# Patient Record
Sex: Male | Born: 1980 | Race: White | Marital: Married | Smoking: Current some day smoker
Health system: Southern US, Community
[De-identification: ages and names within clinical notes are randomized; demographics above are authoritative.]

## PROBLEM LIST (undated history)

## (undated) DIAGNOSIS — Z87442 Personal history of urinary calculi: Secondary | ICD-10-CM

## (undated) DIAGNOSIS — M1990 Unspecified osteoarthritis, unspecified site: Secondary | ICD-10-CM

## (undated) DIAGNOSIS — R5382 Chronic fatigue, unspecified: Secondary | ICD-10-CM

## (undated) DIAGNOSIS — R030 Elevated blood-pressure reading, without diagnosis of hypertension: Secondary | ICD-10-CM

## (undated) DIAGNOSIS — G479 Sleep disorder, unspecified: Secondary | ICD-10-CM

## (undated) DIAGNOSIS — F419 Anxiety disorder, unspecified: Secondary | ICD-10-CM

## (undated) DIAGNOSIS — F32A Depression, unspecified: Secondary | ICD-10-CM

## (undated) DIAGNOSIS — M549 Dorsalgia, unspecified: Secondary | ICD-10-CM

## (undated) DIAGNOSIS — M109 Gout, unspecified: Secondary | ICD-10-CM

## (undated) DIAGNOSIS — R011 Cardiac murmur, unspecified: Secondary | ICD-10-CM

## (undated) DIAGNOSIS — F329 Major depressive disorder, single episode, unspecified: Secondary | ICD-10-CM

## (undated) HISTORY — DX: Dorsalgia, unspecified: M54.9

## (undated) HISTORY — DX: Chronic fatigue, unspecified: R53.82

## (undated) HISTORY — DX: Major depressive disorder, single episode, unspecified: F32.9

## (undated) HISTORY — DX: Cardiac murmur, unspecified: R01.1

## (undated) HISTORY — DX: Elevated blood-pressure reading, without diagnosis of hypertension: R03.0

## (undated) HISTORY — PX: BACK SURGERY: SHX140

## (undated) HISTORY — DX: Sleep disorder, unspecified: G47.9

## (undated) HISTORY — DX: Anxiety disorder, unspecified: F41.9

## (undated) HISTORY — PX: TONSILLECTOMY: SUR1361

## (undated) HISTORY — DX: Unspecified osteoarthritis, unspecified site: M19.90

## (undated) HISTORY — DX: Gout, unspecified: M10.9

## (undated) HISTORY — DX: Depression, unspecified: F32.A

---

## 2014-03-01 ENCOUNTER — Other Ambulatory Visit: Payer: Self-pay | Admitting: Orthopedic Surgery

## 2014-03-01 DIAGNOSIS — M545 Low back pain, unspecified: Secondary | ICD-10-CM

## 2014-03-07 ENCOUNTER — Other Ambulatory Visit: Payer: Self-pay

## 2014-03-08 ENCOUNTER — Other Ambulatory Visit: Payer: Self-pay

## 2014-03-08 ENCOUNTER — Inpatient Hospital Stay: Admission: RE | Admit: 2014-03-08 | Payer: Self-pay | Source: Ambulatory Visit

## 2014-03-14 ENCOUNTER — Other Ambulatory Visit: Payer: Self-pay

## 2014-03-14 ENCOUNTER — Other Ambulatory Visit: Payer: Self-pay | Admitting: Orthopedic Surgery

## 2014-03-14 ENCOUNTER — Ambulatory Visit
Admission: RE | Admit: 2014-03-14 | Discharge: 2014-03-14 | Disposition: A | Discharge status: Home / Self Care | Payer: Managed Care, Other (non HMO) | Source: Ambulatory Visit | Attending: Orthopedic Surgery | Admitting: Orthopedic Surgery

## 2014-03-14 VITALS — BP 126/70 | HR 56 | Temp 98.0°F | Resp 14 | Ht >= 80 in | Wt 265.0 lb

## 2014-03-14 DIAGNOSIS — M545 Low back pain, unspecified: Secondary | ICD-10-CM

## 2014-03-14 DIAGNOSIS — M5440 Lumbago with sciatica, unspecified side: Secondary | ICD-10-CM

## 2014-03-14 MED ORDER — KETOROLAC TROMETHAMINE 30 MG/ML IJ SOLN
30.0000 mg | Freq: Once | INTRAMUSCULAR | Status: AC
  Administered 2014-03-14: 30 mg via INTRAVENOUS

## 2014-03-14 MED ORDER — VANCOMYCIN HCL 10 G IV SOLR
1500.0000 mg | Freq: Once | INTRAVENOUS | Status: AC
  Administered 2014-03-14: 1500 mg via INTRAVENOUS

## 2014-03-14 MED ORDER — MIDAZOLAM HCL 2 MG/2ML IJ SOLN
1.0000 mg | INTRAMUSCULAR | Status: DC | PRN
  Administered 2014-03-14 (×2): 1 mg via INTRAVENOUS

## 2014-03-14 MED ORDER — IOHEXOL 180 MG/ML  SOLN: 5.5000 mL | Freq: Once | INTRAMUSCULAR | Status: AC | PRN

## 2014-03-14 MED ORDER — FENTANYL CITRATE 0.05 MG/ML IJ SOLN
25.0000 ug | INTRAMUSCULAR | Status: DC | PRN
  Administered 2014-03-14: 100 ug via INTRAVENOUS

## 2014-03-14 MED ORDER — MEPERIDINE HCL 100 MG/ML IJ SOLN
100.0000 mg | Freq: Once | INTRAMUSCULAR | Status: AC
  Administered 2014-03-14: 100 mg via INTRAMUSCULAR

## 2014-03-14 MED ORDER — SODIUM CHLORIDE 0.9 % IV SOLN
Freq: Once | INTRAVENOUS | Status: AC
  Administered 2014-03-14: 08:00:00 via INTRAVENOUS

## 2014-03-14 MED ORDER — ONDANSETRON HCL 4 MG/2ML IJ SOLN
4.0000 mg | Freq: Once | INTRAMUSCULAR | Status: AC
  Administered 2014-03-14: 4 mg via INTRAMUSCULAR

## 2014-03-14 NOTE — Discharge Instructions (Signed)
Discogram Post Procedure Discharge Instructions ° °1. May resume a regular diet and any medications that you routinely take (including pain medications). °2. No driving day of procedure. °3. Upon discharge go home and rest for at least 4 hours.  May use an ice pack as needed to injection sites on back.  Ice to back 30 minutes on and 30 minutes off, all day. °4. May remove bandades later, today. °5. It is not unusual to be sore for several days after this procedure. ° ° ° °Please contact our office at [phone] for the following symptoms: ° °· Fever greater than 100 degrees °· Increased swelling, pain, or redness at injection site. ° ° °Thank you for visiting [facility]. ° ° °

## 2014-03-16 ENCOUNTER — Telehealth: Payer: Self-pay | Admitting: Radiology

## 2014-03-16 NOTE — Telephone Encounter (Signed)
Pt's wife called yesterday about pt's positional headache. Pt was asked to do 24 hours more bedrest and blood patch explained. Called this am to check on him and headache is gone. We do have an order for a blood patch if it is still needed.

## 2014-03-17 ENCOUNTER — Telehealth: Payer: Self-pay | Admitting: Radiology

## 2014-04-19 DIAGNOSIS — M549 Dorsalgia, unspecified: Secondary | ICD-10-CM | POA: Insufficient documentation

## 2014-11-24 ENCOUNTER — Encounter (HOSPITAL_COMMUNITY): Payer: Self-pay | Admitting: Cardiology

## 2014-11-24 ENCOUNTER — Emergency Department (HOSPITAL_COMMUNITY): Payer: No Typology Code available for payment source

## 2014-11-24 ENCOUNTER — Emergency Department (HOSPITAL_COMMUNITY)
Admission: EM | Admit: 2014-11-24 | Discharge: 2014-11-24 | Disposition: A | Discharge status: Home / Self Care | Payer: No Typology Code available for payment source | Attending: Emergency Medicine | Admitting: Emergency Medicine

## 2014-11-24 DIAGNOSIS — S335XXA Sprain of ligaments of lumbar spine, initial encounter: Secondary | ICD-10-CM

## 2014-11-24 DIAGNOSIS — Z880 Allergy status to penicillin: Secondary | ICD-10-CM | POA: Diagnosis not present

## 2014-11-24 DIAGNOSIS — S199XXA Unspecified injury of neck, initial encounter: Secondary | ICD-10-CM | POA: Diagnosis present

## 2014-11-24 DIAGNOSIS — Y9389 Activity, other specified: Secondary | ICD-10-CM | POA: Diagnosis not present

## 2014-11-24 DIAGNOSIS — Y92410 Unspecified street and highway as the place of occurrence of the external cause: Secondary | ICD-10-CM | POA: Diagnosis not present

## 2014-11-24 DIAGNOSIS — S139XXA Sprain of joints and ligaments of unspecified parts of neck, initial encounter: Secondary | ICD-10-CM

## 2014-11-24 DIAGNOSIS — S39012A Strain of muscle, fascia and tendon of lower back, initial encounter: Secondary | ICD-10-CM | POA: Insufficient documentation

## 2014-11-24 DIAGNOSIS — Y998 Other external cause status: Secondary | ICD-10-CM | POA: Insufficient documentation

## 2014-11-24 DIAGNOSIS — S339XXA Sprain of unspecified parts of lumbar spine and pelvis, initial encounter: Secondary | ICD-10-CM

## 2014-11-24 DIAGNOSIS — S134XXA Sprain of ligaments of cervical spine, initial encounter: Secondary | ICD-10-CM | POA: Diagnosis not present

## 2014-11-24 MED ORDER — OXYCODONE-ACETAMINOPHEN 5-325 MG PO TABS
2.0000 | ORAL_TABLET | Freq: Once | ORAL | Status: AC
  Administered 2014-11-24: 2 via ORAL
  Filled 2014-11-24: qty 2

## 2014-11-24 MED ORDER — TRAMADOL HCL 50 MG PO TABS: 50.0000 mg | ORAL_TABLET | Freq: Four times a day (QID) | ORAL | Status: DC | PRN
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
## 2014-11-24 NOTE — Discharge Instructions (Signed)
Cervical Sprain °A cervical sprain is an injury in the neck in which the strong, fibrous tissues (ligaments) that connect your neck bones stretch or tear. Cervical sprains can range from mild to severe. Severe cervical sprains can cause the neck vertebrae to be unstable. This can lead to damage of the spinal cord and can result in serious nervous system problems. The amount of time it takes for a cervical sprain to get better depends on the cause and extent of the injury. Most cervical sprains heal in 1 to 3 weeks. °CAUSES  °Severe cervical sprains may be caused by:  °· Contact sport injuries (such as from football, rugby, wrestling, hockey, auto racing, gymnastics, diving, martial arts, or boxing).   °· Motor vehicle collisions.   °· Whiplash injuries. This is an injury from a sudden forward and backward whipping movement of the head and neck.  °· Falls.   °Mild cervical sprains may be caused by:  °· Being in an awkward position, such as while cradling a telephone between your ear and shoulder.   °· Sitting in a chair that does not offer proper support.   °· Working at a poorly designed computer station.   °· Looking up or down for long periods of time.   °SYMPTOMS  °· Pain, soreness, stiffness, or a burning sensation in the front, back, or sides of the neck. This discomfort may develop immediately after the injury or slowly, 24 hours or more after the injury.   °· Pain or tenderness directly in the middle of the back of the neck.   °· Shoulder or upper back pain.   °· Limited ability to move the neck.   °· Headache.   °· Dizziness.   °· Weakness, numbness, or tingling in the hands or arms.   °· Muscle spasms.   °· Difficulty swallowing or chewing.   °· Tenderness and swelling of the neck.   °DIAGNOSIS  °Most of the time your health care provider can diagnose a cervical sprain by taking your history and doing a physical exam. Your health care provider will ask about previous neck injuries and any known neck  problems, such as arthritis in the neck. X-rays may be taken to find out if there are any other problems, such as with the bones of the neck. Other tests, such as a CT scan or MRI, may also be needed.  °TREATMENT  °Treatment depends on the severity of the cervical sprain. Mild sprains can be treated with rest, keeping the neck in place (immobilization), and pain medicines. Severe cervical sprains are immediately immobilized. Further treatment is done to help with pain, muscle spasms, and other symptoms and may include: °· Medicines, such as pain relievers, numbing medicines, or muscle relaxants.   °· Physical therapy. This may involve stretching exercises, strengthening exercises, and posture training. Exercises and improved posture can help stabilize the neck, strengthen muscles, and help stop symptoms from returning.   °HOME CARE INSTRUCTIONS  °· Put ice on the injured area.   °¨ Put ice in a plastic bag.   °¨ Place a towel between your skin and the bag.   °¨ Leave the ice on for 15-20 minutes, 3-4 times a day.   °· If your injury was severe, you may have been given a cervical collar to wear. A cervical collar is a two-piece collar designed to keep your neck from moving while it heals. °¨ Do not remove the collar unless instructed by your health care provider. °¨ If you have long hair, keep it outside of the collar. °¨ Ask your health care provider before making any adjustments to your collar. Minor   adjustments may be required over time to improve comfort and reduce pressure on your chin or on the back of your head. °¨ If you are allowed to remove the collar for cleaning or bathing, follow your health care provider's instructions on how to do so safely. °¨ Keep your collar clean by wiping it with mild soap and water and drying it completely. If the collar you have been given includes removable pads, remove them every 1-2 days and hand wash them with soap and water. Allow them to air dry. They should be completely  dry before you wear them in the collar. °¨ If you are allowed to remove the collar for cleaning and bathing, wash and dry the skin of your neck. Check your skin for irritation or sores. If you see any, tell your health care provider. °¨ Do not drive while wearing the collar.   °· Only take over-the-counter or prescription medicines for pain, discomfort, or fever as directed by your health care provider.   °· Keep all follow-up appointments as directed by your health care provider.   °· Keep all physical therapy appointments as directed by your health care provider.   °· Make any needed adjustments to your workstation to promote good posture.   °· Avoid positions and activities that make your symptoms worse.   °· Warm up and stretch before being active to help prevent problems.   °SEEK MEDICAL CARE IF:  °· Your pain is not controlled with medicine.   °· You are unable to decrease your pain medicine over time as planned.   °· Your activity level is not improving as expected.   °SEEK IMMEDIATE MEDICAL CARE IF:  °· You develop any bleeding. °· You develop stomach upset. °· You have signs of an allergic reaction to your medicine.   °· Your symptoms get worse.   °· You develop new, unexplained symptoms.   °· You have numbness, tingling, weakness, or paralysis in any part of your body.   °MAKE SURE YOU:  °· Understand these instructions. °· Will watch your condition. °· Will get help right away if you are not doing well or get worse. °Document Released: 06/30/2007 Document Revised: 09/07/2013 Document Reviewed: 03/10/2013 °Exit[employer]® Patient Information ©2015 [employer]. This information is not intended to replace advice given to you by your health care provider. Make sure you discuss any questions you have with your health care provider. ° °Motor Vehicle Collision °It is common to have multiple bruises and sore muscles after a motor vehicle collision (MVC). These tend to feel worse for the first 24 hours. You may have  the most stiffness and soreness over the first several hours. You may also feel worse when you wake up the first morning after your collision. After this point, you will usually begin to improve with each day. The speed of improvement often depends on the severity of the collision, the number of injuries, and the location and nature of these injuries. °HOME CARE INSTRUCTIONS °· Put ice on the injured area. °¨ Put ice in a plastic bag. °¨ Place a towel between your skin and the bag. °¨ Leave the ice on for 15-20 minutes, 3-4 times a day, or as directed by your health care provider. °· Drink enough fluids to keep your urine clear or pale yellow. Do not drink alcohol. °· Take a warm shower or bath once or twice a day. This will increase blood flow to sore muscles. °· You may return to activities as directed by your caregiver. Be careful when lifting, as this may aggravate neck or back   pain. °· Only take over-the-counter or prescription medicines for pain, discomfort, or fever as directed by your caregiver. Do not use aspirin. This may increase bruising and bleeding. °SEEK IMMEDIATE MEDICAL CARE IF: °· You have numbness, tingling, or weakness in the arms or legs. °· You develop severe headaches not relieved with medicine. °· You have severe neck pain, especially tenderness in the middle of the back of your neck. °· You have changes in bowel or bladder control. °· There is increasing pain in any area of the body. °· You have shortness of breath, light-headedness, dizziness, or fainting. °· You have chest pain. °· You feel sick to your stomach (nauseous), throw up (vomit), or sweat. °· You have increasing abdominal discomfort. °· There is blood in your urine, stool, or vomit. °· You have pain in your shoulder (shoulder strap areas). °· You feel your symptoms are getting worse. °MAKE SURE YOU: °· Understand these instructions. °· Will watch your condition. °· Will get help right away if you are not doing well or get  worse. °Document Released: 09/02/2005 Document Revised: 01/17/2014 Document Reviewed: 01/30/2011 °ExitCare® Patient Information ©2015 [employer]. This information is not intended to replace advice given to you by your health care provider. Make sure you discuss any questions you have with your health care provider. ° °
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
## 2016-06-03 ENCOUNTER — Encounter (HOSPITAL_COMMUNITY): Payer: Self-pay

## 2016-06-03 ENCOUNTER — Ambulatory Visit (HOSPITAL_COMMUNITY): Payer: Self-pay | Admitting: Psychiatry

## 2016-07-17 ENCOUNTER — Encounter: Payer: Self-pay | Admitting: Neurology

## 2016-07-18 ENCOUNTER — Ambulatory Visit: Payer: Managed Care, Other (non HMO) | Admitting: Neurology

## 2016-07-18 ENCOUNTER — Telehealth: Payer: Self-pay

## 2016-07-18 NOTE — Telephone Encounter (Signed)
Pt no-showed his new patient appt this morning. 

## 2016-07-23 ENCOUNTER — Encounter: Payer: Self-pay | Admitting: Neurology

## 2016-08-12 ENCOUNTER — Ambulatory Visit: Payer: Managed Care, Other (non HMO) | Admitting: Neurology

## 2016-08-12 ENCOUNTER — Telehealth: Payer: Self-pay

## 2016-08-12 NOTE — Telephone Encounter (Signed)
Pt no showed 2nd new patient appt this morning.

## 2016-08-13 ENCOUNTER — Encounter: Payer: Self-pay | Admitting: Neurology

## 2019-04-22 ENCOUNTER — Encounter: Payer: Self-pay | Admitting: *Deleted

## 2019-04-23 ENCOUNTER — Telehealth: Payer: Self-pay | Admitting: Cardiology

## 2019-04-23 ENCOUNTER — Other Ambulatory Visit: Payer: Self-pay

## 2019-04-23 ENCOUNTER — Ambulatory Visit (INDEPENDENT_AMBULATORY_CARE_PROVIDER_SITE_OTHER): Payer: 59 | Admitting: Cardiology

## 2019-04-23 ENCOUNTER — Encounter: Payer: Self-pay | Admitting: *Deleted

## 2019-04-23 ENCOUNTER — Encounter: Payer: Self-pay | Admitting: Cardiology

## 2019-04-23 VITALS — BP 121/77 | HR 58 | Ht >= 80 in | Wt 348.6 lb

## 2019-04-23 DIAGNOSIS — R011 Cardiac murmur, unspecified: Secondary | ICD-10-CM

## 2019-04-23 NOTE — Progress Notes (Signed)
     Clinical Summary Cody Fuller is a 38 y.o.male seen as new consult, referred by Dr Kelby Aline for heart murmur   1. Heart murmur - some SOB at times he attributes to weight gain - has had some issues with chronic fatigue - does 2 miles twice a week jogging a week.  - mild edema at times     Past Medical History:  Diagnosis Date  . Back pain   . Chronic fatigue   . Depression   . Elevated blood pressure reading without diagnosis of hypertension   . Heart murmur   . Sleep disorder      Allergies  Allergen Reactions  . Codeine   . Penicillins      Current Outpatient Medications  Medication Sig Dispense Refill  . CREATINE PO Take 3 tablets by mouth 2 (two) times daily.    Marland Kitchen FLUoxetine (PROZAC) 40 MG capsule Take 40 mg by mouth daily.    Marland Kitchen oxyCODONE-acetaminophen (PERCOCET) 10-325 MG tablet Take 1 tablet by mouth every 4 (four) hours as needed for pain.    . tizanidine (ZANAFLEX) 2 MG capsule Take 2 mg by mouth daily as needed for muscle spasms.    . traMADol (ULTRAM) 50 MG tablet Take 1 tablet (50 mg total) by mouth every 6 (six) hours as needed. 10 tablet 0   No current facility-administered medications for this visit.      Past Surgical History:  Procedure Laterality Date  . TONSILLECTOMY       Allergies  Allergen Reactions  . Codeine   . Penicillins       No family history on file.   Social History Cody Fuller reports that he has never smoked. He has never used smokeless tobacco. Cody Fuller reports no history of alcohol use.   Review of Systems CONSTITUTIONAL: No weight loss, fever, chills, weakness or fatigue.  HEENT: Eyes: No visual loss, blurred vision, double vision or yellow sclerae.No hearing loss, sneezing, congestion, runny nose or sore throat.  SKIN: No rash or itching.  CARDIOVASCULAR: per hpi RESPIRATORY: No shortness of breath, cough or sputum.  GASTROINTESTINAL: No anorexia, nausea, vomiting or diarrhea. No abdominal pain or blood.   GENITOURINARY: No burning on urination, no polyuria NEUROLOGICAL: No headache, dizziness, syncope, paralysis, ataxia, numbness or tingling in the extremities. No change in bowel or bladder control.  MUSCULOSKELETAL: No muscle, back pain, joint pain or stiffness.  LYMPHATICS: No enlarged nodes. No history of splenectomy.  PSYCHIATRIC: No history of depression or anxiety.  ENDOCRINOLOGIC: No reports of sweating, cold or heat intolerance. No polyuria or polydipsia.  Marland Kitchen   Physical Examination Today's Vitals   04/23/19 1537  BP: 121/77  Pulse: (!) 58  SpO2: 98%  Weight: (!) 348 lb 9.6 oz (158.1 kg)  Height: 6\' 8"  (2.032 m)   Body mass index is 38.3 kg/m.  Gen: resting comfortably, no acute distress HEENT: no scleral icterus, pupils equal round and reactive, no palptable cervical adenopathy,  CV: RRR, 2/6 systolic murmur at apex, no jvd Resp: Clear to auscultation bilaterally GI: abdomen is soft, non-tender, non-distended, normal bowel sounds, no hepatosplenomegaly MSK: extremities are warm, no edema.  Skin: warm, no rash Neuro:  no focal deficits Psych: appropriate affect     Assessment and Plan  1. Heart murmur - obtain echo to further evaluate.      Arnoldo Lenis, M.D.

## 2019-04-23 NOTE — Patient Instructions (Addendum)
Medication Instructions:   Your physician recommends that you continue on your current medications as directed. Please refer to the Current Medication list given to you today.  Labwork:  NONE  Testing/Procedures: Your physician has requested that you have an echocardiogram. Echocardiography is a painless test that uses sound waves to create images of your heart. It provides your doctor with information about the size and shape of your heart and how well your heart's chambers and valves are working. This procedure takes approximately one hour. There are no restrictions for this procedure.  Follow-Up:  Your physician recommends that you schedule a follow-up appointment in: pending test result.  Any Other Special Instructions Will Be Listed Below (If Applicable).  If you need a refill on your cardiac medications before your next appointment, please call your pharmacy. 

## 2019-04-23 NOTE — Telephone Encounter (Signed)
°  Precert needed for: Echo ° ° °Location: [facility]  °  ° °Date: May 13, 2019 °

## 2019-05-13 ENCOUNTER — Other Ambulatory Visit: Payer: 59

## 2019-06-29 ENCOUNTER — Telehealth (HOSPITAL_COMMUNITY): Payer: Self-pay | Admitting: Family Medicine

## 2019-06-29 NOTE — Telephone Encounter (Signed)
06/29/19 11:17am Called and left msg for patient due to referral from [place], [place] - waiting for patient to call back/sh

## 2019-07-01 ENCOUNTER — Emergency Department (HOSPITAL_COMMUNITY)
Admission: EM | Admit: 2019-07-01 | Discharge: 2019-07-01 | Disposition: A | Discharge status: Home / Self Care | Payer: 59 | Attending: Emergency Medicine | Admitting: Emergency Medicine

## 2019-07-01 ENCOUNTER — Encounter (HOSPITAL_COMMUNITY): Payer: Self-pay | Admitting: *Deleted

## 2019-07-01 ENCOUNTER — Other Ambulatory Visit: Payer: Self-pay

## 2019-07-01 ENCOUNTER — Emergency Department (HOSPITAL_COMMUNITY): Payer: 59

## 2019-07-01 DIAGNOSIS — Z5321 Procedure and treatment not carried out due to patient leaving prior to being seen by health care provider: Secondary | ICD-10-CM | POA: Diagnosis not present

## 2019-07-01 DIAGNOSIS — R0602 Shortness of breath: Secondary | ICD-10-CM | POA: Insufficient documentation

## 2019-07-01 DIAGNOSIS — M79602 Pain in left arm: Secondary | ICD-10-CM | POA: Diagnosis present

## 2019-07-01 LAB — CBC
HCT: 44.8 % (ref 39.0–52.0)
Hemoglobin: 14.6 g/dL (ref 13.0–17.0)
MCH: 27.1 pg (ref 26.0–34.0)
MCHC: 32.6 g/dL (ref 30.0–36.0)
MCV: 83.1 fL (ref 80.0–100.0)
Platelets: 284 10*3/uL (ref 150–400)
RBC: 5.39 MIL/uL (ref 4.22–5.81)
RDW: 13.4 % (ref 11.5–15.5)
WBC: 7 10*3/uL (ref 4.0–10.5)
nRBC: 0 % (ref 0.0–0.2)

## 2019-07-01 LAB — BASIC METABOLIC PANEL
Anion gap: 11 (ref 5–15)
BUN: 14 mg/dL (ref 6–20)
CO2: 22 mmol/L (ref 22–32)
Calcium: 8.9 mg/dL (ref 8.9–10.3)
Chloride: 106 mmol/L (ref 98–111)
Creatinine, Ser: 1.22 mg/dL (ref 0.61–1.24)
GFR calc Af Amer: >60 mL/min (ref >60)
GFR calc non Af Amer: >60 mL/min (ref >60)
Glucose, Bld: 106 mg/dL — ABNORMAL HIGH (ref 70–99)
Potassium: 3.1 mmol/L — ABNORMAL LOW (ref 3.5–5.1)
Sodium: 139 mmol/L (ref 135–145)

## 2019-07-01 LAB — TROPONIN I (HIGH SENSITIVITY)
Troponin I (High Sensitivity): 8 ng/L (ref <18)
Troponin I (High Sensitivity): 8 ng/L (ref <18)

## 2019-07-01 MED ORDER — SODIUM CHLORIDE 0.9% FLUSH: 3.0000 mL | Freq: Once | INTRAVENOUS | Status: DC
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
## 2020-04-27 ENCOUNTER — Encounter: Payer: Self-pay | Admitting: Urology

## 2020-04-27 ENCOUNTER — Other Ambulatory Visit: Payer: Self-pay

## 2020-04-27 ENCOUNTER — Ambulatory Visit (INDEPENDENT_AMBULATORY_CARE_PROVIDER_SITE_OTHER): Payer: 59 | Admitting: Urology

## 2020-04-27 VITALS — BP 169/84 | HR 71 | Temp 98.4°F | Ht >= 80 in | Wt 290.0 lb

## 2020-04-27 DIAGNOSIS — E291 Testicular hypofunction: Secondary | ICD-10-CM

## 2020-04-27 LAB — URINALYSIS, ROUTINE W REFLEX MICROSCOPIC
Bilirubin, UA: NEGATIVE
Glucose, UA: NEGATIVE
Ketones, UA: NEGATIVE
Leukocytes,UA: NEGATIVE
Nitrite, UA: NEGATIVE
Protein,UA: NEGATIVE
RBC, UA: NEGATIVE
Specific Gravity, UA: 1.02 (ref 1.005–1.030)
Urobilinogen, Ur: 2 mg/dL — ABNORMAL HIGH (ref 0.2–1.0)
pH, UA: 7.5 (ref 5.0–7.5)

## 2020-04-27 NOTE — Progress Notes (Signed)
Urological Symptom Review  Patient is experiencing the following symptoms: Erection problems (male only)   Review of Systems  Gastrointestinal (upper)  : Negative for upper GI symptoms  Gastrointestinal (lower) : Negative for lower GI symptoms  Constitutional : Fatigue  Skin: Negative for skin symptoms  Eyes: Negative for eye symptoms  Ear/Nose/Throat : Negative for Ear/Nose/Throat symptoms  Hematologic/Lymphatic: Negative for Hematologic/Lymphatic symptoms  Cardiovascular : Negative for cardiovascular symptoms  Respiratory : Negative for respiratory symptoms  Endocrine: Negative for endocrine symptoms  Musculoskeletal: Back pain  Neurological: Negative for neurological symptoms  Psychologic: Anxiety 

## 2020-04-27 NOTE — Progress Notes (Signed)
04/27/2020 11:45 AM   Delena Serve 1980-12-07 [id number]  Referring provider: Lawerance Sabal, PA [address] [place],  [place] [address]  Fatigue  HPI: [name] is a 39yo here for evaluation of hypogonadism. For the past 2 years he has noticed progressive fatigue, decreased libido, weight gain. Per patient his testosterone was checked and was low normal. He has gained 30lbs in the past 6 months. Decreased exercise desire and tolerance. No hx of testicular trauma.  He has issues maintaining an erection. He has always been athletic but now he cannot workout and exercise.    PMH: Past Medical History:  Diagnosis Date  . Anxiety   . Arthritis   . Back pain   . Chronic fatigue   . Depression   . Elevated blood pressure reading without diagnosis of hypertension   . Gout   . Heart murmur   . Sleep disorder     Surgical History: Past Surgical History:  Procedure Laterality Date  . TONSILLECTOMY      Home Medications:  Allergies as of 04/27/2020      Reactions   Codeine    Penicillins       Medication List       Accurate as of April 27, 2020 11:45 AM. If you have any questions, ask your nurse or doctor.        busPIRone 10 MG tablet Commonly known as: BUSPAR Take 10 mg by mouth 3 (three) times daily.   FLUoxetine 40 MG capsule Commonly known as: PROZAC Take 40 mg by mouth daily.   oxyCODONE-acetaminophen 10-325 MG tablet Commonly known as: PERCOCET Take 1 tablet by mouth every 4 (four) hours as needed for pain.   tizanidine 2 MG capsule Commonly known as: ZANAFLEX Take 2 mg by mouth daily as needed for muscle spasms.       Allergies:  Allergies  Allergen Reactions  . Codeine   . Penicillins     Family History: Family History  Problem Relation Age of Onset  . HIV Father   . Cancer Mother     Social History:  reports that he has been smoking cigarettes. He started smoking about 19 months ago. He has never used smokeless tobacco. He reports that he  does not drink alcohol and does not use drugs.  ROS: All other review of systems were reviewed and are negative except what is noted above in HPI  Physical Exam: BP (!) 169/84   Pulse 71   Temp 98.4 F (36.9 C)   Ht 6\' 8"  (2.032 m)   Wt 290 lb (131.5 kg)   BMI 31.86 kg/m   Constitutional:  Alert and oriented, No acute distress. HEENT: [place] AT, moist mucus membranes.  Trachea midline, no masses. Cardiovascular: No clubbing, cyanosis, or edema. Respiratory: Normal respiratory effort, no increased work of breathing. GI: Abdomen is soft, nontender, nondistended, no abdominal masses GU: No CVA tenderness. Circumcised phallus. No masses/lesions on penis, testis, scrotum. Prostate 40g smooth no nodules no induration.  Lymph: No cervical or inguinal lymphadenopathy. Skin: No rashes, bruises or suspicious lesions. Neurologic: Grossly intact, no focal deficits, moving all 4 extremities. Psychiatric: Normal mood and affect.  Laboratory Data: Lab Results  Component Value Date   WBC 7.0 07/01/2019   HGB 14.6 07/01/2019   HCT 44.8 07/01/2019   MCV 83.1 07/01/2019   PLT 284 07/01/2019    Lab Results  Component Value Date   CREATININE 1.22 07/01/2019    No results found for: PSA  No results found for: TESTOSTERONE  No results found for: HGBA1C  Urinalysis No results found for: COLORURINE, APPEARANCEUR, LABSPEC, PHURINE, GLUCOSEU, HGBUR, BILIRUBINUR, KETONESUR, PROTEINUR, UROBILINOGEN, NITRITE, LEUKOCYTESUR  No results found for: LABMICR, WBCUA, RBCUA, LABEPIT, MUCUS, BACTERIA  Pertinent Imaging:  No results found for this or any previous visit.  No results found for this or any previous visit.  No results found for this or any previous visit.  No results found for this or any previous visit.  No results found for this or any previous visit.  No results found for this or any previous visit.  No results found for this or any previous visit.  No results found for this or  any previous visit.   Assessment & Plan:    1. Hypogonadism male -We will obtain testosterone labs prior to initiating therapy  -The patient and I talked at length about hypogonadism and erectile dysfunction.  Several etiologies of hypogonadism were discussed with the patient at length.  A wide range of treatment options were discussed with the patient including conservative or no treatment, testosterone supplementation, and others.  Alternative treatment options were discussed with the patient in detail.  All questions were answered.      The patient gave fully informed consent to proceed with testosterone supplementation therapy for his hypogonadism.  He understands that with testosterone supplementation therapy, we are only supplementing his serum testosterone levels, not raising his levels above the normal range.  He understands that testosterone may promote the growth of benign prostate tissue and result in bothersome voiding symptoms, which he will report to me.  He also understands that it is important to take testosterone exactly the way it is prescribed without deviation.      We talked about the risks, benefits and some of the possible side effects of various methods of testosterone administration, including tablets, IM injections, topical creams and ointments, and topical epidermal patches.      We will begin testosterone supplementation for his hypogonadism.                                                                 The patient was given instructions to call for abdominal pain, pelvic pain, perirectal pain, nausea, vomiting, diarrhea, fever over 100 degrees F, chills, hematuria, dysuria, frequency, urgency, or urge incontinence.    No follow-ups on file.  Wilkie Aye, MD  [facility] [place]

## 2020-04-27 NOTE — Patient Instructions (Signed)
Testosterone Replacement Therapy  Testosterone replacement therapy (TRT) is used to treat men who have a low testosterone level (hypogonadism). Testosterone is a male hormone that is produced in the testicles. It is responsible for typically male characteristics and for maintaining a man's sex drive and the ability to get an erection. Testosterone also supports bone and muscle health. TRT can be a gel, liquid, or patch that you put on your skin. It can also be in the form of a tablet or an injection. In some cases, your health care provider may insert long-acting pellets under your skin. In most men, the level of testosterone starts to decline gradually after age 45. Low testosterone can also be caused by certain medical conditions, medicines, and obesity. Your health care provider can diagnose hypogonadism with at least two blood tests that are done early in the morning. Low testosterone may not need to be treated. TRT is usually a choice that you make with your health care provider. Your health care provider may recommend TRT if you have low testosterone that is causing symptoms, such as:  Low sex drive.  Erection problems.  Breast enlargement.  Loss of body hair.  Weak muscles or bones.  Shrinking testicles.  Increased body fat.  Low energy.  Hot flashes.  Depression.  Decreased work performance. TRT is a lifetime treatment. If you stop treatment, your testosterone will drop, and your symptoms may return. What are the risks? Testosterone replacement therapy may have side effects, including:  Lower sperm count.  Skin irritation at the application or injection site.  Mouth irritation if you take an oral tablet.  Acne.  Swelling of your legs or feet.  Tender breasts.  Dizziness.  Sleep disturbance.  Mood swings.  Possible increased risk of stroke or heart attack. Testosterone replacement therapy may also increase your risk for prostate cancer or male breast cancer.  You should not use TRT if you have either of those conditions. Your health care provider also may not recommend TRT if:  You are suspected of having prostate cancer.  You want to father a child.  You have a high number of red blood cells.  You have untreated sleep apnea.  You have a very large prostate. Supplies needed:  Your health care provider will prescribe the testosterone gel, solution, or medicine that you need. If your health care provider teaches you to do self-injections at home, you will also need: ? Your medicine vial. ? Disposable needles and syringes. ? Alcohol swabs. ? A needle disposal container. ? Adhesive bandages. How to use testosterone replacement therapy Your health care provider will help you find the TRT option that will work best for you based on your preference, the side effects, and the cost. You may:  Rub testosterone gel on your upper arm or shoulder every day after a shower. This is the most common type of TRT. Do not let women or children come in contact with the gel.  Apply a testosterone solution under your arms once each day.  Place a testosterone patch on your skin once each day.  Dissolve a testosterone tablet in your mouth twice each day.  Have a testosterone pellet inserted under your skin by your health care provider. This will be replaced every 3-6 months.  Use testosterone nasal spray three times each day.  Get testosterone injections. For some types of testosterone, your health care provider will give you this injection. With other types of testosterone, you may be taught to give injections to   yourself. The frequency of injections may vary based on the type of testosterone that you receive. Follow these instructions at home:  Take over-the-counter and prescription medicines only as told by your health care provider.  Lose weight if you are overweight. Ask your health care provider to help you start a healthy diet and exercise program to  reach and maintain a healthy weight.  Work with your health care provider to treat other medical conditions that may lower your testosterone. These include obesity, high blood pressure, high cholesterol, diabetes, liver disease, kidney disease, and sleep apnea.  Keep all follow-up visits as told by your health care provider. This is important. General recommendations  Discuss all risks and benefits with your health care provider before starting therapy.  Work with your health care provider to check your prostate health and do blood testing before you start therapy.  Do not use any testosterone replacement therapies that are not prescribed by your health care provider or not approved for use in the [place]  Do not use TRT for bodybuilding or to improve sexual performance. TRT should be used only to treat symptoms of low testosterone.  Return for all repeat prostate checks and blood tests during therapy, as told by your health care provider. Where to find more information Learn more about testosterone replacement therapy from:  American U[employer] Foundation: www.urologyhealth.org/urologic-conditions/low-testosterone-(hypogonadism)  Endocrine Society: www.hormone.org/diseases-and-conditions/mens-health/hypogonadism Contact a health care provider if:  You have side effects from your testosterone replacement therapy.  You continue to have symptoms of low testosterone during treatment.  You develop new symptoms during treatment. Summary  Testosterone replacement therapy is only for men who have low testosterone as determined by blood testing and who have symptoms of low testosterone.  Testosterone replacement therapy should be prescribed only by a health care provider and should be used under the supervision of a health care provider.  You may not be able to take testosterone if you have certain medical conditions, including prostate cancer, male breast cancer, or heart  disease.  Testosterone replacement therapy may have side effects and may make some medical conditions worse.  Talk with your health care provider about all the risks and benefits before you start therapy. This information is not intended to replace advice given to you by your health care provider. Make sure you discuss any questions you have with your health care provider. Document Revised: 09/15/2016 Document Reviewed: 05/23/2016 Elsevier Patient Education  2020 Elsevier Inc.  

## 2020-04-27 NOTE — Addendum Note (Signed)
Addended byGustavus Messing on: 04/27/2020 01:12 PM   Modules accepted: Orders

## 2020-04-28 ENCOUNTER — Other Ambulatory Visit: Payer: Self-pay

## 2020-04-28 DIAGNOSIS — E291 Testicular hypofunction: Secondary | ICD-10-CM

## 2020-04-29 LAB — CBC WITH DIFFERENTIAL
Basophils Absolute: 0 10*3/uL (ref 0.0–0.2)
Basos: 1 %
EOS (ABSOLUTE): 0.1 10*3/uL (ref 0.0–0.4)
Eos: 2 %
Hematocrit: 49.3 % (ref 37.5–51.0)
Hemoglobin: 16.4 g/dL (ref 13.0–17.7)
Immature Grans (Abs): 0 10*3/uL (ref 0.0–0.1)
Immature Granulocytes: 0 %
Lymphocytes Absolute: 1.8 10*3/uL (ref 0.7–3.1)
Lymphs: 40 %
MCH: 27.6 pg (ref 26.6–33.0)
MCHC: 33.3 g/dL (ref 31.5–35.7)
MCV: 83 fL (ref 79–97)
Monocytes Absolute: 0.5 10*3/uL (ref 0.1–0.9)
Monocytes: 10 %
Neutrophils Absolute: 2.1 10*3/uL (ref 1.4–7.0)
Neutrophils: 47 %
RBC: 5.95 x10E6/uL — ABNORMAL HIGH (ref 4.14–5.80)
RDW: 13 % (ref 11.6–15.4)
WBC: 4.5 10*3/uL (ref 3.4–10.8)

## 2020-04-29 LAB — COMPREHENSIVE METABOLIC PANEL
ALT: 25 IU/L (ref 0–44)
AST: 26 IU/L (ref 0–40)
Albumin/Globulin Ratio: 1.8 (ref 1.2–2.2)
Albumin: 4.4 g/dL (ref 4.0–5.0)
Alkaline Phosphatase: 56 IU/L (ref 48–121)
BUN/Creatinine Ratio: 11 (ref 9–20)
BUN: 13 mg/dL (ref 6–20)
Bilirubin Total: 0.6 mg/dL (ref 0.0–1.2)
CO2: 24 mmol/L (ref 20–29)
Calcium: 9.5 mg/dL (ref 8.7–10.2)
Chloride: 103 mmol/L (ref 96–106)
Creatinine, Ser: 1.16 mg/dL (ref 0.76–1.27)
GFR calc Af Amer: 93 mL/min/{1.73_m2} (ref >59)
GFR calc non Af Amer: 81 mL/min/{1.73_m2} (ref >59)
Globulin, Total: 2.4 g/dL (ref 1.5–4.5)
Glucose: 96 mg/dL (ref 65–99)
Potassium: 4.9 mmol/L (ref 3.5–5.2)
Sodium: 141 mmol/L (ref 134–144)
Total Protein: 6.8 g/dL (ref 6.0–8.5)

## 2020-04-29 LAB — ESTRADIOL: Estradiol: 27 pg/mL (ref 7.6–42.6)

## 2020-05-03 ENCOUNTER — Telehealth: Payer: Self-pay

## 2020-05-03 LAB — TESTOSTERONE,FREE AND TOTAL
Testosterone, Free: 8.1 pg/mL — ABNORMAL LOW (ref 8.7–25.1)
Testosterone: 865 ng/dL (ref 264–916)

## 2020-05-03 NOTE — Telephone Encounter (Signed)
-----   Message from Malen Gauze, MD sent at 05/03/2020  7:35 AM EDT ----- Testosterone labs were normal. He can try b12 10000 units BID ----- Message ----- From: Gustavus Messing, LPN Sent: 01/31/6159   8:09 AM EDT To: Malen Gauze, MD  Please review

## 2020-05-08 NOTE — Telephone Encounter (Signed)
Pt made aware

## 2020-07-27 ENCOUNTER — Ambulatory Visit: Payer: Self-pay | Admitting: Urology

## 2020-07-27 DIAGNOSIS — E291 Testicular hypofunction: Secondary | ICD-10-CM

## 2021-06-03 IMAGING — CR DG CHEST 2V
2 series · 2 of 2 positions shown · non-contrast
Comparison: None.

CLINICAL DATA: 37-year-old male with sudden onset mid chest pain
and shortness of breath.

EXAM:
CHEST - 2 VIEW

[w chest pa]
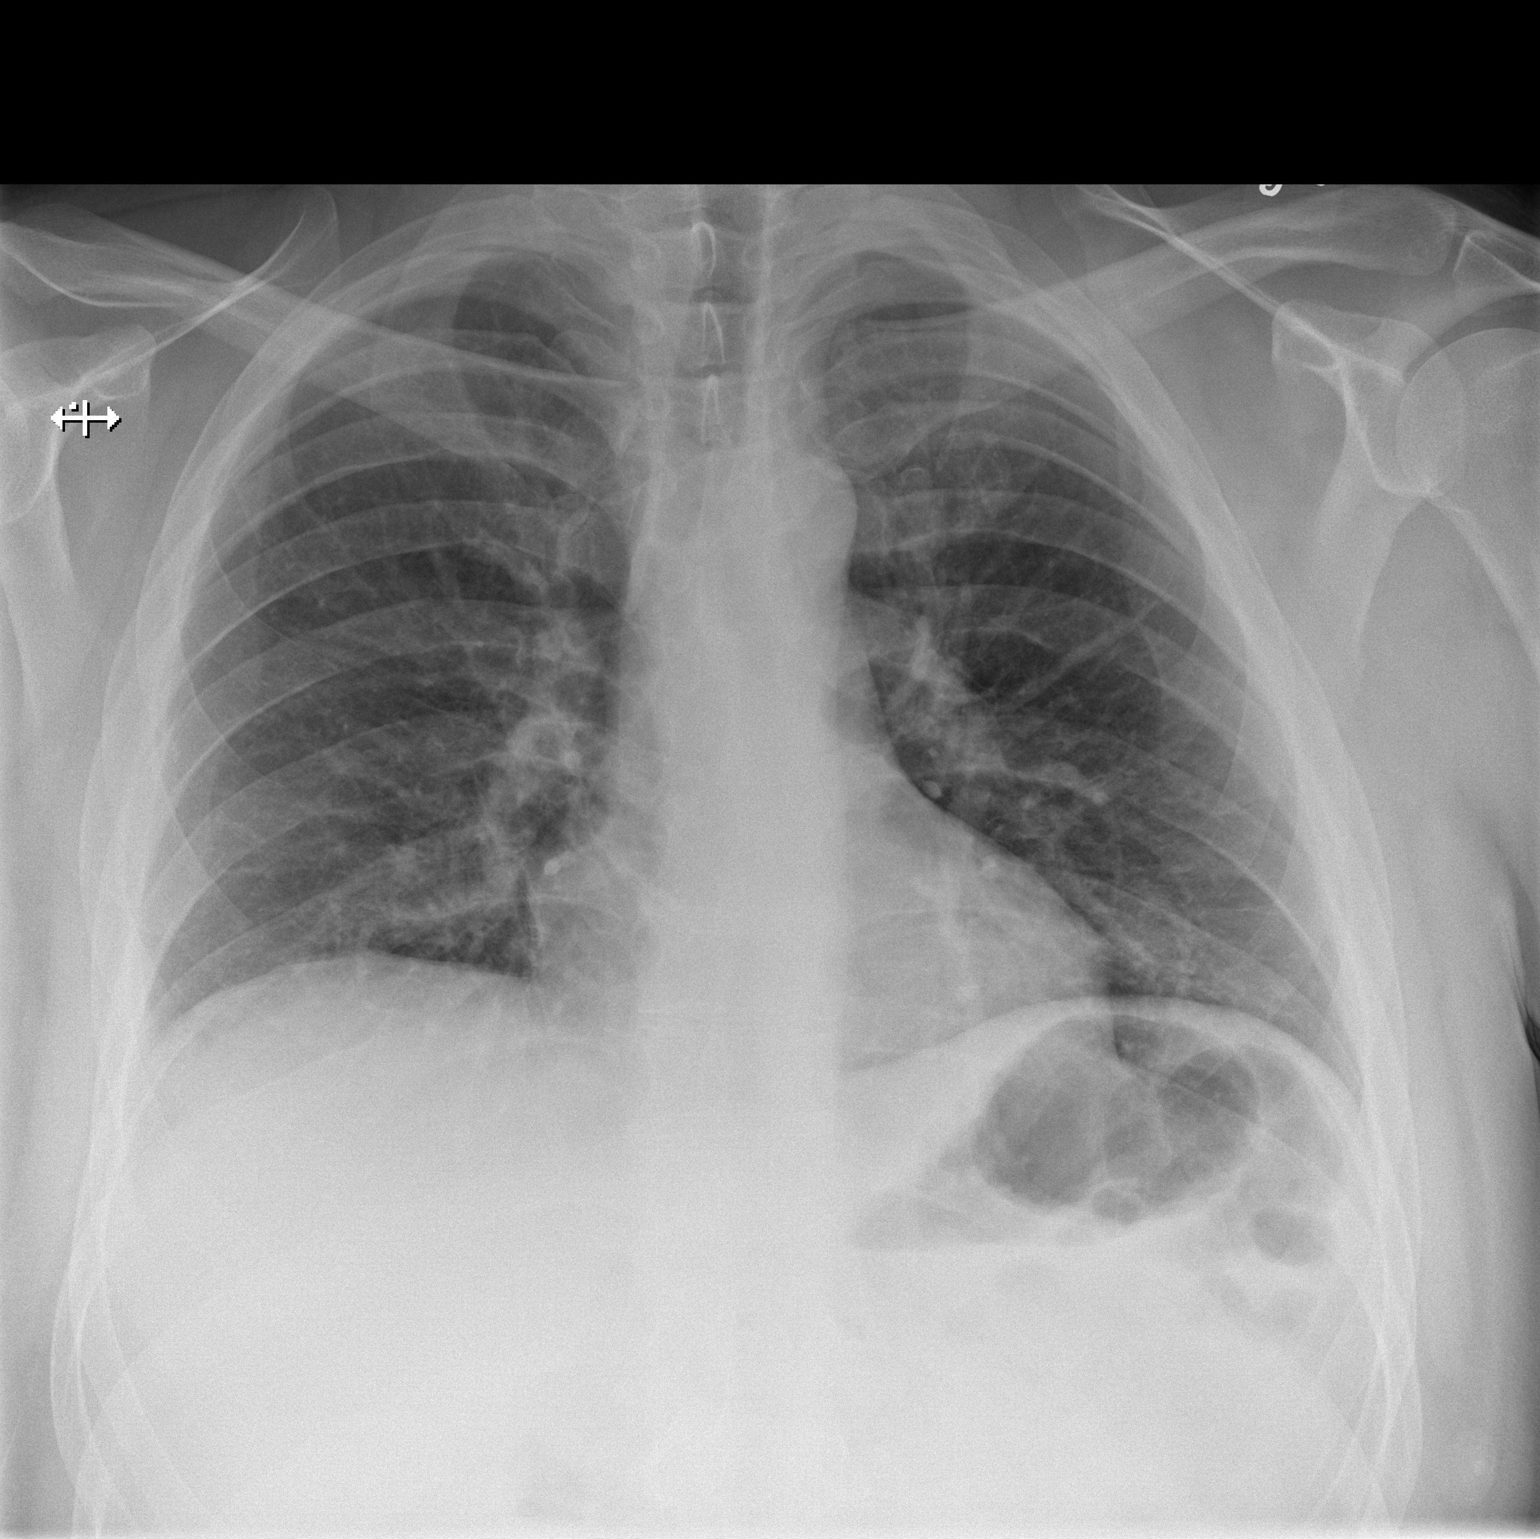

[w chest lat]
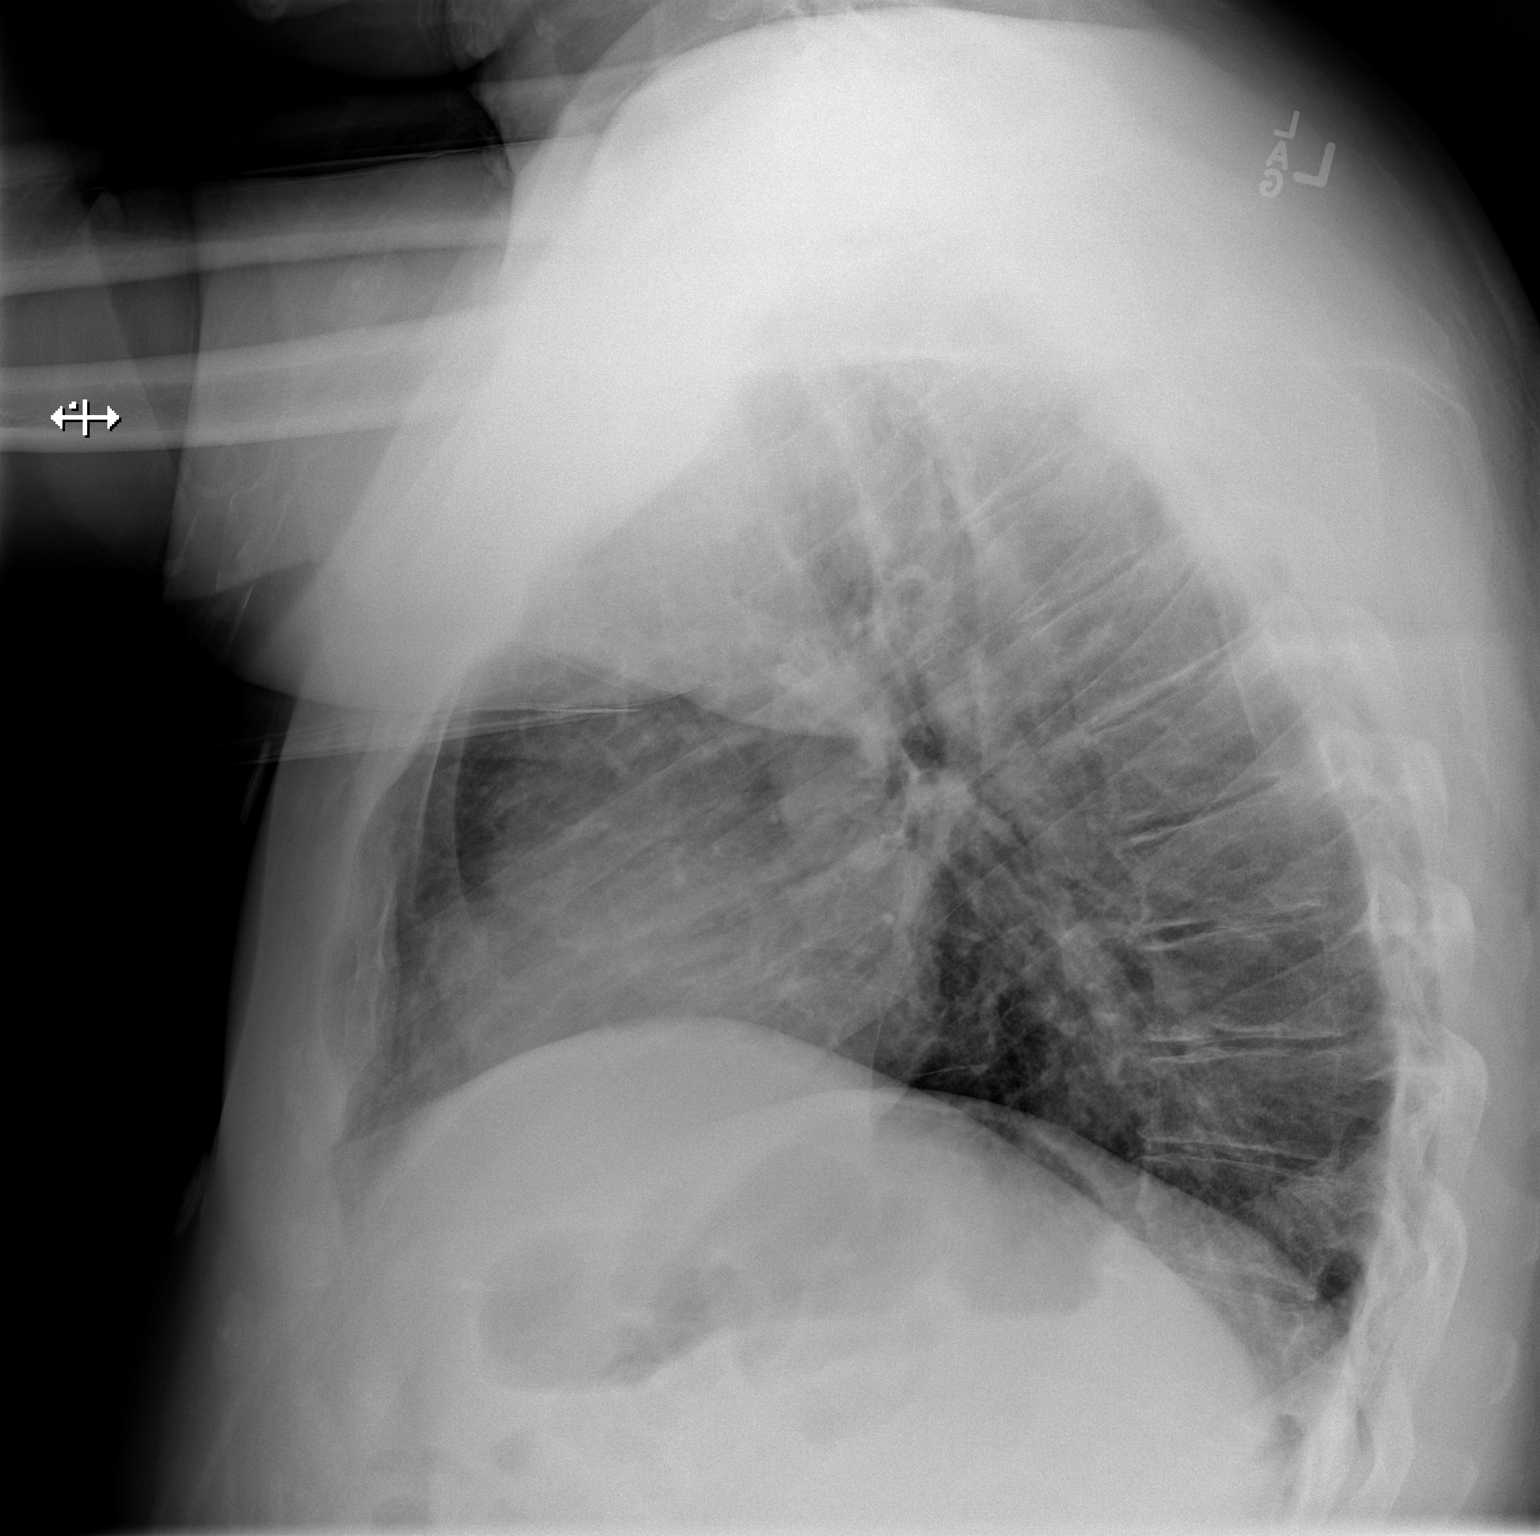

[2 of 2 positions shown; findings below may reference images not displayed]

FINDINGS: Mildly low lung volumes. Normal cardiac size and mediastinal
contours. Visualized tracheal air column is within normal limits.
Both lungs appear clear. No pneumothorax or pleural effusion.

Negative visible bowel gas pattern. No acute osseous abnormality
identified.
IMPRESSION: Negative; somewhat low lung volumes with no cardiopulmonary
abnormality.

## 2024-04-19 ENCOUNTER — Emergency Department (HOSPITAL_COMMUNITY)

## 2024-04-19 ENCOUNTER — Emergency Department (HOSPITAL_COMMUNITY)
Admission: EM | Admit: 2024-04-19 | Discharge: 2024-04-19 | Disposition: A | Discharge status: Home / Self Care | Source: Ambulatory Visit | Attending: Emergency Medicine | Admitting: Emergency Medicine

## 2024-04-19 ENCOUNTER — Other Ambulatory Visit: Payer: Self-pay

## 2024-04-19 ENCOUNTER — Encounter (HOSPITAL_COMMUNITY): Payer: Self-pay | Admitting: Emergency Medicine

## 2024-04-19 DIAGNOSIS — R109 Unspecified abdominal pain: Secondary | ICD-10-CM

## 2024-04-19 DIAGNOSIS — N200 Calculus of kidney: Secondary | ICD-10-CM | POA: Insufficient documentation

## 2024-04-19 LAB — CBC WITH DIFFERENTIAL/PLATELET
Abs Immature Granulocytes: 0.02 K/uL (ref 0.00–0.07)
Basophils Absolute: 0.1 K/uL (ref 0.0–0.1)
Basophils Relative: 1 %
Eosinophils Absolute: 0.1 K/uL (ref 0.0–0.5)
Eosinophils Relative: 2 %
HCT: 50 % (ref 39.0–52.0)
Hemoglobin: 16.7 g/dL (ref 13.0–17.0)
Immature Granulocytes: 0 %
Lymphocytes Relative: 39 %
Lymphs Abs: 1.9 K/uL (ref 0.7–4.0)
MCH: 27.5 pg (ref 26.0–34.0)
MCHC: 33.4 g/dL (ref 30.0–36.0)
MCV: 82.4 fL (ref 80.0–100.0)
Monocytes Absolute: 0.5 K/uL (ref 0.1–1.0)
Monocytes Relative: 9 %
Neutro Abs: 2.4 K/uL (ref 1.7–7.7)
Neutrophils Relative %: 49 %
Platelets: 211 K/uL (ref 150–400)
RBC: 6.07 MIL/uL — ABNORMAL HIGH (ref 4.22–5.81)
RDW: 13.3 % (ref 11.5–15.5)
WBC: 4.9 K/uL (ref 4.0–10.5)
nRBC: 0 % (ref 0.0–0.2)

## 2024-04-19 LAB — COMPREHENSIVE METABOLIC PANEL WITH GFR
ALT: 35 U/L (ref 0–44)
AST: 27 U/L (ref 15–41)
Albumin: 4.3 g/dL (ref 3.5–5.0)
Alkaline Phosphatase: 42 U/L (ref 38–126)
Anion gap: 11 (ref 5–15)
BUN: 15 mg/dL (ref 6–20)
CO2: 23 mmol/L (ref 22–32)
Calcium: 9.3 mg/dL (ref 8.9–10.3)
Chloride: 100 mmol/L (ref 98–111)
Creatinine, Ser: 1.25 mg/dL — ABNORMAL HIGH (ref 0.61–1.24)
GFR, Estimated: >60 mL/min (ref >60)
Glucose, Bld: 146 mg/dL — ABNORMAL HIGH (ref 70–99)
Potassium: 3.7 mmol/L (ref 3.5–5.1)
Sodium: 134 mmol/L — ABNORMAL LOW (ref 135–145)
Total Bilirubin: 1.5 mg/dL — ABNORMAL HIGH (ref 0.0–1.2)
Total Protein: 8.1 g/dL (ref 6.5–8.1)

## 2024-04-19 MED ORDER — KETOROLAC TROMETHAMINE 15 MG/ML IJ SOLN
15.0000 mg | Freq: Once | INTRAMUSCULAR | Status: AC
Start: 2024-04-19 — End: 2024-04-19
  Administered 2024-04-19: 15 mg via INTRAVENOUS
  Filled 2024-04-19: qty 1

## 2024-04-19 MED ORDER — TAMSULOSIN HCL 0.4 MG PO CAPS: 0.4000 mg | ORAL_CAPSULE | Freq: Every day | ORAL | 0 refills | Status: DC

## 2024-04-19 MED ORDER — HYDROMORPHONE HCL 1 MG/ML IJ SOLN
1.0000 mg | Freq: Once | INTRAMUSCULAR | Status: AC
  Administered 2024-04-19: 1 mg via INTRAVENOUS
  Filled 2024-04-19: qty 1

## 2024-04-19 MED ORDER — ONDANSETRON HCL 4 MG/2ML IJ SOLN
4.0000 mg | Freq: Once | INTRAMUSCULAR | Status: AC
  Administered 2024-04-19: 4 mg via INTRAVENOUS
  Filled 2024-04-19: qty 2

## 2024-04-19 MED ORDER — OXYCODONE-ACETAMINOPHEN 5-325 MG PO TABS: 1.0000 | ORAL_TABLET | Freq: Four times a day (QID) | ORAL | 0 refills | Status: DC | PRN

## 2024-04-19 MED ORDER — SODIUM CHLORIDE 0.9 % IV BOLUS
1000.0000 mL | Freq: Once | INTRAVENOUS | Status: AC
  Administered 2024-04-19: 1000 mL via INTRAVENOUS

## 2024-04-19 MED ORDER — CYCLOBENZAPRINE HCL 10 MG PO TABS: 10.0000 mg | ORAL_TABLET | Freq: Three times a day (TID) | ORAL | 0 refills | Status: DC | PRN

## 2024-04-19 NOTE — ED Provider Notes (Signed)
  EMERGENCY DEPARTMENT AT [facility] Provider Note   CSN: [id number] Arrival date & time: 04/19/24  1405     Patient presents with: Flank Pain   Cody Fuller is a 43 y.o. male.  He is here with left low back pain radiating to his left lower quadrant it has been going on over a week.  Associated with some urinary burning.  PCP put him on an antibiotic.  Had an ultrasound on Friday and was told today that he has a kidney stone.  Pain is unbearable.  No fevers nausea vomiting.  History of kidney stone in the past.  {Add pertinent medical, surgical, social history, OB history to YEP:67052} The history is provided by the patient.  Flank Pain This is a new problem. The current episode started more than 1 week ago. The problem occurs constantly. The problem has not changed since onset.Associated symptoms include abdominal pain. Pertinent negatives include no chest pain, no headaches and no shortness of breath. Nothing aggravates the symptoms. Nothing relieves the symptoms. He has tried rest for the symptoms. The treatment provided no relief.       Prior to Admission medications   Medication Sig Start Date End Date Taking? Authorizing Provider  busPIRone (BUSPAR) 10 MG tablet Take 10 mg by mouth 3 (three) times daily.    [provider]  FLUoxetine (PROZAC) 40 MG capsule Take 40 mg by mouth daily.    [provider]  oxyCODONE -acetaminophen  (PERCOCET) 10-325 MG tablet Take 1 tablet by mouth every 4 (four) hours as needed for pain.    [provider]  tizanidine (ZANAFLEX) 2 MG capsule Take 2 mg by mouth daily as needed for muscle spasms.    [provider]    Allergies: Codeine and Penicillins    Review of Systems  Respiratory:  Negative for shortness of breath.   Cardiovascular:  Negative for chest pain.  Gastrointestinal:  Positive for abdominal pain.  Genitourinary:  Positive for dysuria and flank pain.  Musculoskeletal:  Positive  for back pain.  Neurological:  Negative for headaches.    Updated Vital Signs BP (!) 168/99 (BP Location: Left Arm)   Pulse 71   Temp 98.5 F (36.9 C) (Oral)   Resp 19   SpO2 94%   Physical Exam Vitals and nursing note reviewed.  Constitutional:      General: He is not in acute distress.    Appearance: Normal appearance. He is well-developed.  HENT:     Head: Normocephalic and atraumatic.  Eyes:     Conjunctiva/sclera: Conjunctivae normal.  Cardiovascular:     Rate and Rhythm: Normal rate and regular rhythm.     Heart sounds: No murmur heard. Pulmonary:     Effort: Pulmonary effort is normal. No respiratory distress.     Breath sounds: Normal breath sounds.  Abdominal:     Palpations: Abdomen is soft.     Tenderness: There is no abdominal tenderness. There is no guarding or rebound.  Musculoskeletal:        General: No swelling.     Cervical back: Neck supple.  Skin:    General: Skin is warm and dry.     Capillary Refill: Capillary refill takes less than 2 seconds.  Neurological:     General: No focal deficit present.     Mental Status: He is alert.     Motor: No weakness.  Psychiatric:        Mood and Affect: Mood normal.     (  all labs ordered are listed, but only abnormal results are displayed) Labs Reviewed  COMPREHENSIVE METABOLIC PANEL WITH GFR - Abnormal; Notable for the following components:      Result Value   Sodium 134 (*)    Glucose, Bld 146 (*)    Creatinine, Ser 1.25 (*)    Total Bilirubin 1.5 (*)    All other components within normal limits  CBC WITH DIFFERENTIAL/PLATELET - Abnormal; Notable for the following components:   RBC 6.07 (*)    All other components within normal limits  URINALYSIS, ROUTINE W REFLEX MICROSCOPIC    EKG: None  Radiology: No results found.  {Document cardiac monitor, telemetry assessment procedure when appropriate:32947} Procedures   Medications Ordered in the ED  HYDROmorphone  (DILAUDID ) injection 1 mg (has  no administration in time range)  sodium chloride  0.9 % bolus 1,000 mL (has no administration in time range)  ketorolac  (TORADOL ) 15 MG/ML injection 15 mg (15 mg Intravenous Given 04/19/24 1516)  ondansetron  (ZOFRAN ) injection 4 mg (4 mg Intravenous Given 04/19/24 1515)      {Click here for ABCD2, HEART and other calculators REFRESH Note before signing:1}                              Medical Decision Making Amount and/or Complexity of Data Reviewed Labs: ordered.  Risk Prescription drug management.   This patient complains of ***; this involves an extensive number of treatment Options and is a complaint that carries with it a high risk of complications and morbidity. The differential includes ***  I ordered, reviewed and interpreted labs, which included *** I ordered medication *** and reviewed PMP when indicated. I ordered imaging studies which included *** and I independently    visualized and interpreted imaging which showed *** Additional history obtained from *** Previous records obtained and reviewed *** I consulted *** and discussed lab and imaging findings and discussed disposition.  Cardiac monitoring reviewed, *** Social determinants considered, *** Critical Interventions: ***  After the interventions stated above, I reevaluated the patient and found *** Admission and further testing considered, ***   {Document critical care time when appropriate  Document review of labs and clinical decision tools ie CHADS2VASC2, etc  Document your independent review of radiology images and any outside records  Document your discussion with family members, caretakers and with consultants  Document social determinants of health affecting pt's care  Document your decision making why or why not admission, treatments were needed:32947:::1}   Final diagnoses:  None    ED Discharge Orders     None

## 2024-04-19 NOTE — ED Triage Notes (Signed)
 MD told him to come over due to 1.1 kidney stone in his left kidney. He reports extreme pain.

## 2024-04-19 NOTE — ED Notes (Signed)
 Discharge instructions reviewed with patient. Patient questions answered and opportunity for education reviewed. Patient voices understanding of discharge instructions with no further questions.

## 2024-04-19 NOTE — Discharge Instructions (Signed)
 Your CAT scan showed stones in both kidneys but not on the tube going from the kidney to the bladder.  These usually do not cause any symptoms.  We are prescribing you some pain medicine and muscle relaxant along with some medication that may help pass stones.  Please follow with your primary care doctor and urology.  Return if any high fevers or worsening symptoms

## 2024-04-19 NOTE — ED Provider Triage Note (Signed)
 Emergency Medicine Provider Triage Evaluation Note  Carless Slatten , a 43 y.o. male  was evaluated in triage.  Pt complains of left-sided flank pain radiating to the left groin, 1.1 cm stone was noted in the left kidney by ultrasound according to the patient.  His physician sent him to the ED for pain management and ultimate definitive management of his nephro/urolithiasis..  Also notes nausea.  Review of Systems  Positive: As above Negative:   Physical Exam  BP (!) 168/99 (BP Location: Left Arm)   Pulse 71   Temp 98.5 F (36.9 C) (Oral)   Resp 19   SpO2 94%  Gen:   Awake, no distress   Resp:  Normal effort  MSK:   Moves extremities without difficulty  Other:    Medical Decision Making  Medically screening exam initiated at 3:02 PM.  Appropriate orders placed.  Marquet Faircloth was informed that the remainder of the evaluation will be completed by another provider, this initial triage assessment does not replace that evaluation, and the importance of remaining in the ED until their evaluation is complete.  Ordered initial lab evaluation along with CT scan of the abdomen pelvis with renal stone evaluation, ordered pain control with ketorolac  and azithromycin for nausea control.   Myriam Dorn BROCK[place], [place] 04/19/24 1504

## 2024-06-30 ENCOUNTER — Encounter: Payer: Self-pay | Admitting: Urology

## 2024-06-30 ENCOUNTER — Ambulatory Visit (INDEPENDENT_AMBULATORY_CARE_PROVIDER_SITE_OTHER): Admitting: Urology

## 2024-06-30 ENCOUNTER — Ambulatory Visit (HOSPITAL_COMMUNITY)
Admission: RE | Admit: 2024-06-30 | Discharge: 2024-06-30 | Disposition: A | Discharge status: Home / Self Care | Source: Ambulatory Visit | Attending: Urology | Admitting: Urology

## 2024-06-30 VITALS — BP 151/84 | HR 80

## 2024-06-30 DIAGNOSIS — N200 Calculus of kidney: Secondary | ICD-10-CM | POA: Diagnosis present

## 2024-06-30 NOTE — Patient Instructions (Signed)

## 2024-06-30 NOTE — H&P (View-Only) (Signed)
 06/30/2024 2:53 PM   Cody Fuller [birth year] [id number]  Referring provider: Job Bolt, PA [address] [place],  [place] [address]  nephrolithiasis   HPI: Cody Fuller is a 42yo here for evaluation of nephrolithiasis. He presented to the ER with left flank pain over 2 months ago and underwent CT which showed left renal calculi up to 1.1cm and a small right renal calculus. He continues to have intermittent left flank pain. No worsening LUTS   PMH: Past Medical History:  Diagnosis Date   Anxiety    Arthritis    Back pain    Chronic fatigue    Depression    Elevated blood pressure reading without diagnosis of hypertension    Gout    Heart murmur    Sleep disorder     Surgical History: Past Surgical History:  Procedure Laterality Date   TONSILLECTOMY      Home Medications:  Allergies as of 06/30/2024       Reactions   Codeine    Penicillins         Medication List        Accurate as of June 30, 2024  2:53 PM. If you have any questions, ask your nurse or doctor.          buPROPion 300 MG 24 hr tablet Commonly known as: WELLBUTRIN XL Take 300 mg by mouth daily.   busPIRone 10 MG tablet Commonly known as: BUSPAR Take 10 mg by mouth 3 (three) times daily.   cyclobenzaprine  10 MG tablet Commonly known as: FLEXERIL  Take 1 tablet (10 mg total) by mouth 3 (three) times daily as needed for muscle spasms.   FLUoxetine 40 MG capsule Commonly known as: PROZAC Take 40 mg by mouth daily.   oxyCODONE -acetaminophen  10-325 MG tablet Commonly known as: PERCOCET Take 1 tablet by mouth every 4 (four) hours as needed for pain.   oxyCODONE -acetaminophen  5-325 MG tablet Commonly known as: PERCOCET/ROXICET Take 1 tablet by mouth every 6 (six) hours as needed for severe pain (pain score 7-10).   tamsulosin  0.4 MG Caps capsule Commonly known as: FLOMAX  Take 1 capsule (0.4 mg total) by mouth daily.   tizanidine 2 MG capsule Commonly known as:  ZANAFLEX Take 2 mg by mouth daily as needed for muscle spasms.        Allergies:  Allergies  Allergen Reactions   Codeine    Penicillins     Family History: Family History  Problem Relation Age of Onset   HIV Father    Cancer Mother     Social History:  reports that he has been smoking cigarettes. He started smoking about 5 years ago. He has never used smokeless tobacco. He reports that he does not drink alcohol and does not use drugs.  ROS: All other review of systems were reviewed and are negative except what is noted above in HPI  Physical Exam: BP (!) 151/84   Pulse 80   Constitutional:  Alert and oriented, No acute distress. HEENT: [place] AT, moist mucus membranes.  Trachea midline, no masses. Cardiovascular: No clubbing, cyanosis, or edema. Respiratory: Normal respiratory effort, no increased work of breathing. GI: Abdomen is soft, nontender, nondistended, no abdominal masses GU: No CVA tenderness.  Lymph: No cervical or inguinal lymphadenopathy. Skin: No rashes, bruises or suspicious lesions. Neurologic: Grossly intact, no focal deficits, moving all 4 extremities. Psychiatric: Normal mood and affect.  Laboratory Data: Lab Results  Component Value Date   WBC 4.9 04/19/2024  HGB 16.7 04/19/2024   HCT 50.0 04/19/2024   MCV 82.4 04/19/2024   PLT 211 04/19/2024    Lab Results  Component Value Date   CREATININE 1.25 (H) 04/19/2024    No results found for: PSA  Lab Results  Component Value Date   TESTOSTERONE 865 04/28/2020    No results found for: HGBA1C  Urinalysis    Component Value Date/Time   APPEARANCEUR Clear 04/27/2020 1313   GLUCOSEU Negative 04/27/2020 1313   BILIRUBINUR Negative 04/27/2020 1313   PROTEINUR Negative 04/27/2020 1313   NITRITE Negative 04/27/2020 1313   LEUKOCYTESUR Negative 04/27/2020 1313    Lab Results  Component Value Date   LABMICR Comment 04/27/2020    Pertinent Imaging: CT 8/4 and KUB today: Images  reviewed and discussed with the patient  No results found for this or any previous visit.  No results found for this or any previous visit.  No results found for this or any previous visit.  No results found for this or any previous visit.  No results found for this or any previous visit.  No results found for this or any previous visit.  No results found for this or any previous visit.  Results for orders placed during the hospital encounter of 04/19/24  CT Renal Stone Study  Narrative EXAM: CT UROGRAM 04/19/2024 06:26:00 PM  TECHNIQUE: CT of the abdomen and pelvis was performed before and after the administration of intravenous contrast as per CT urogram protocol. Automated exposure control, iterative reconstruction, and/or weight based adjustment of the mA/kV was utilized to reduce the radiation dose to as low as reasonably achievable.  COMPARISON: None available.  CLINICAL HISTORY: Abdominal/flank pain, stone suspected. MD told him to come over due to 1.1 cm kidney stone in his left kidney. He reports extreme pain.  FINDINGS:  LOWER CHEST: No acute abnormality.  LIVER: The liver is unremarkable.  GALLBLADDER AND BILE DUCTS: Gallbladder is unremarkable. No biliary ductal dilatation.  SPLEEN: No acute abnormality.  PANCREAS: No acute abnormality.  ADRENAL GLANDS: No acute abnormality.  KIDNEYS, URETERS AND BLADDER: Bilateral nonobstructing nephrolithiasis. No hydronephrosis. No perinephric or periureteral stranding. Urinary bladder is unremarkable.  GI AND BOWEL: Stomach demonstrates no acute abnormality. There is no bowel obstruction. Normal appendix.  PERITONEUM AND RETROPERITONEUM: No ascites. No free air.  VASCULATURE: Aorta is normal in caliber.  LYMPH NODES: No lymphadenopathy.  REPRODUCTIVE ORGANS: No acute abnormality.  BONES AND SOFT TISSUES: No acute osseous abnormality. No focal soft tissue abnormality.  IMPRESSION: 1.  Bilateral nonobstructing nephrolithiasis, with the largest stone measuring 0.9 cm on the left. 2. No hydronephrosis.  Electronically signed by: Cody Gatlin MD 04/19/2024 07:23 PM EDT RP Workstation: [id number]   Assessment & Plan:    1. Nephrolithiasis (Primary) -We discussed the management of kidney stones. These options include observation, ureteroscopy, shockwave lithotripsy (ESWL) and percutaneous nephrolithotomy (PCNL). We discussed which options are relevant to the patient's stone(s). We discussed the natural history of kidney stones as well as the complications of untreated stones and the impact on quality of life without treatment as well as with each of the above listed treatments. We also discussed the efficacy of each treatment in its ability to clear the stone burden. With any of these management options I discussed the signs and symptoms of infection and the need for emergent treatment should these be experienced. For each option we discussed the ability of each procedure to clear the patient of their stone burden.   For observation I  described the risks which include but are not limited to silent renal damage, life-threatening infection, need for emergent surgery, failure to pass stone and pain.   For ureteroscopy I described the risks which include bleeding, infection, damage to contiguous structures, positioning injury, ureteral stricture, ureteral avulsion, ureteral injury, need for prolonged ureteral stent, inability to perform ureteroscopy, need for an interval procedure, inability to clear stone burden, stent discomfort/pain, heart attack, stroke, pulmonary embolus and the inherent risks with general anesthesia.   For shockwave lithotripsy I described the risks which include arrhythmia, kidney contusion, kidney hemorrhage, need for transfusion, pain, inability to adequately break up stone, inability to pass stone fragments, Steinstrasse, infection associated with obstructing  stones, need for alternate surgical procedure, need for repeat shockwave lithotripsy, MI, CVA, PE and the inherent risks with anesthesia/conscious sedation.   For PCNL I described the risks including positioning injury, pneumothorax, hydrothorax, need for chest tube, inability to clear stone burden, renal laceration, arterial venous fistula or malformation, need for embolization of kidney, loss of kidney or renal function, need for repeat procedure, need for prolonged nephrostomy tube, ureteral avulsion, MI, CVA, PE and the inherent risks of general anesthesia.   - The patient would like to proceed with bilateral ureteroscopic stone extraction - DG Abd 1 View   No follow-ups on file.  [name], MD  [facility]

## 2024-06-30 NOTE — Progress Notes (Signed)
 06/30/2024 2:53 PM   Lynwood Domino [birth year] [id number]  Referring provider: Job Bolt, PA [address] [place],  [place] [address]  nephrolithiasis   HPI: [name] is a 43yo here for evaluation of nephrolithiasis. He presented to the ER with left flank pain over 2 months ago and underwent CT which showed left renal calculi up to 1.1cm and a small right renal calculus. He continues to have intermittent left flank pain. No worsening LUTS   PMH: Past Medical History:  Diagnosis Date   Anxiety    Arthritis    Back pain    Chronic fatigue    Depression    Elevated blood pressure reading without diagnosis of hypertension    Gout    Heart murmur    Sleep disorder     Surgical History: Past Surgical History:  Procedure Laterality Date   TONSILLECTOMY      Home Medications:  Allergies as of 06/30/2024       Reactions   Codeine    Penicillins         Medication List        Accurate as of June 30, 2024  2:53 PM. If you have any questions, ask your nurse or doctor.          buPROPion 300 MG 24 hr tablet Commonly known as: WELLBUTRIN XL Take 300 mg by mouth daily.   busPIRone 10 MG tablet Commonly known as: BUSPAR Take 10 mg by mouth 3 (three) times daily.   cyclobenzaprine  10 MG tablet Commonly known as: FLEXERIL  Take 1 tablet (10 mg total) by mouth 3 (three) times daily as needed for muscle spasms.   FLUoxetine 40 MG capsule Commonly known as: PROZAC Take 40 mg by mouth daily.   oxyCODONE -acetaminophen  10-325 MG tablet Commonly known as: PERCOCET Take 1 tablet by mouth every 4 (four) hours as needed for pain.   oxyCODONE -acetaminophen  5-325 MG tablet Commonly known as: PERCOCET/ROXICET Take 1 tablet by mouth every 6 (six) hours as needed for severe pain (pain score 7-10).   tamsulosin  0.4 MG Caps capsule Commonly known as: FLOMAX  Take 1 capsule (0.4 mg total) by mouth daily.   tizanidine 2 MG capsule Commonly known as:  ZANAFLEX Take 2 mg by mouth daily as needed for muscle spasms.        Allergies:  Allergies  Allergen Reactions   Codeine    Penicillins     Family History: Family History  Problem Relation Age of Onset   HIV Father    Cancer Mother     Social History:  reports that he has been smoking cigarettes. He started smoking about 5 years ago. He has never used smokeless tobacco. He reports that he does not drink alcohol and does not use drugs.  ROS: All other review of systems were reviewed and are negative except what is noted above in HPI  Physical Exam: BP (!) 151/84   Pulse 80   Constitutional:  Alert and oriented, No acute distress. HEENT: [place] AT, moist mucus membranes.  Trachea midline, no masses. Cardiovascular: No clubbing, cyanosis, or edema. Respiratory: Normal respiratory effort, no increased work of breathing. GI: Abdomen is soft, nontender, nondistended, no abdominal masses GU: No CVA tenderness.  Lymph: No cervical or inguinal lymphadenopathy. Skin: No rashes, bruises or suspicious lesions. Neurologic: Grossly intact, no focal deficits, moving all 4 extremities. Psychiatric: Normal mood and affect.  Laboratory Data: Lab Results  Component Value Date   WBC 4.9 04/19/2024  HGB 16.7 04/19/2024   HCT 50.0 04/19/2024   MCV 82.4 04/19/2024   PLT 211 04/19/2024    Lab Results  Component Value Date   CREATININE 1.25 (H) 04/19/2024    No results found for: PSA  Lab Results  Component Value Date   TESTOSTERONE 865 04/28/2020    No results found for: HGBA1C  Urinalysis    Component Value Date/Time   APPEARANCEUR Clear 04/27/2020 1313   GLUCOSEU Negative 04/27/2020 1313   BILIRUBINUR Negative 04/27/2020 1313   PROTEINUR Negative 04/27/2020 1313   NITRITE Negative 04/27/2020 1313   LEUKOCYTESUR Negative 04/27/2020 1313    Lab Results  Component Value Date   LABMICR Comment 04/27/2020    Pertinent Imaging: CT 8/4 and KUB today: Images  reviewed and discussed with the patient  No results found for this or any previous visit.  No results found for this or any previous visit.  No results found for this or any previous visit.  No results found for this or any previous visit.  No results found for this or any previous visit.  No results found for this or any previous visit.  No results found for this or any previous visit.  Results for orders placed during the hospital encounter of 04/19/24  CT Renal Stone Study  Narrative EXAM: CT UROGRAM 04/19/2024 06:26:00 PM  TECHNIQUE: CT of the abdomen and pelvis was performed before and after the administration of intravenous contrast as per CT urogram protocol. Automated exposure control, iterative reconstruction, and/or weight based adjustment of the mA/kV was utilized to reduce the radiation dose to as low as reasonably achievable.  COMPARISON: None available.  CLINICAL HISTORY: Abdominal/flank pain, stone suspected. MD told him to come over due to 1.1 cm kidney stone in his left kidney. He reports extreme pain.  FINDINGS:  LOWER CHEST: No acute abnormality.  LIVER: The liver is unremarkable.  GALLBLADDER AND BILE DUCTS: Gallbladder is unremarkable. No biliary ductal dilatation.  SPLEEN: No acute abnormality.  PANCREAS: No acute abnormality.  ADRENAL GLANDS: No acute abnormality.  KIDNEYS, URETERS AND BLADDER: Bilateral nonobstructing nephrolithiasis. No hydronephrosis. No perinephric or periureteral stranding. Urinary bladder is unremarkable.  GI AND BOWEL: Stomach demonstrates no acute abnormality. There is no bowel obstruction. Normal appendix.  PERITONEUM AND RETROPERITONEUM: No ascites. No free air.  VASCULATURE: Aorta is normal in caliber.  LYMPH NODES: No lymphadenopathy.  REPRODUCTIVE ORGANS: No acute abnormality.  BONES AND SOFT TISSUES: No acute osseous abnormality. No focal soft tissue abnormality.  IMPRESSION: 1.  Bilateral nonobstructing nephrolithiasis, with the largest stone measuring 0.9 cm on the left. 2. No hydronephrosis.  Electronically signed by: Norman Gatlin MD 04/19/2024 07:23 PM EDT RP Workstation: [id number]   Assessment & Plan:    1. Nephrolithiasis (Primary) -We discussed the management of kidney stones. These options include observation, ureteroscopy, shockwave lithotripsy (ESWL) and percutaneous nephrolithotomy (PCNL). We discussed which options are relevant to the patient's stone(s). We discussed the natural history of kidney stones as well as the complications of untreated stones and the impact on quality of life without treatment as well as with each of the above listed treatments. We also discussed the efficacy of each treatment in its ability to clear the stone burden. With any of these management options I discussed the signs and symptoms of infection and the need for emergent treatment should these be experienced. For each option we discussed the ability of each procedure to clear the patient of their stone burden.   For observation I  described the risks which include but are not limited to silent renal damage, life-threatening infection, need for emergent surgery, failure to pass stone and pain.   For ureteroscopy I described the risks which include bleeding, infection, damage to contiguous structures, positioning injury, ureteral stricture, ureteral avulsion, ureteral injury, need for prolonged ureteral stent, inability to perform ureteroscopy, need for an interval procedure, inability to clear stone burden, stent discomfort/pain, heart attack, stroke, pulmonary embolus and the inherent risks with general anesthesia.   For shockwave lithotripsy I described the risks which include arrhythmia, kidney contusion, kidney hemorrhage, need for transfusion, pain, inability to adequately break up stone, inability to pass stone fragments, Steinstrasse, infection associated with obstructing  stones, need for alternate surgical procedure, need for repeat shockwave lithotripsy, MI, CVA, PE and the inherent risks with anesthesia/conscious sedation.   For PCNL I described the risks including positioning injury, pneumothorax, hydrothorax, need for chest tube, inability to clear stone burden, renal laceration, arterial venous fistula or malformation, need for embolization of kidney, loss of kidney or renal function, need for repeat procedure, need for prolonged nephrostomy tube, ureteral avulsion, MI, CVA, PE and the inherent risks of general anesthesia.   - The patient would like to proceed with bilateral ureteroscopic stone extraction - DG Abd 1 View   No follow-ups on file.  [name], MD  [facility]

## 2024-07-13 ENCOUNTER — Encounter (HOSPITAL_COMMUNITY)
Admission: RE | Admit: 2024-07-13 | Discharge: 2024-07-13 | Disposition: A | Discharge status: Home / Self Care | Source: Ambulatory Visit | Attending: Urology | Admitting: Urology

## 2024-07-13 ENCOUNTER — Encounter (HOSPITAL_COMMUNITY): Payer: Self-pay

## 2024-07-14 MED ORDER — GENTAMICIN SULFATE 40 MG/ML IJ SOLN
5.0000 mg/kg | INTRAVENOUS | Status: AC
  Administered 2024-07-15: 550 mg via INTRAVENOUS
  Filled 2024-07-14: qty 13.75

## 2024-07-15 ENCOUNTER — Encounter (HOSPITAL_COMMUNITY): Payer: Self-pay | Admitting: Urology

## 2024-07-15 ENCOUNTER — Other Ambulatory Visit: Payer: Self-pay

## 2024-07-15 ENCOUNTER — Ambulatory Visit (HOSPITAL_COMMUNITY)

## 2024-07-15 ENCOUNTER — Ambulatory Visit (HOSPITAL_COMMUNITY)
Admission: RE | Admit: 2024-07-15 | Discharge: 2024-07-15 | Disposition: A | Discharge status: Home / Self Care | Attending: Urology | Admitting: Urology

## 2024-07-15 ENCOUNTER — Encounter (HOSPITAL_COMMUNITY)
Admission: RE | Disposition: A | Discharge status: Home / Self Care | Payer: Self-pay | Source: Home / Self Care | Attending: Urology

## 2024-07-15 ENCOUNTER — Other Ambulatory Visit: Payer: Self-pay | Admitting: Urology

## 2024-07-15 DIAGNOSIS — N200 Calculus of kidney: Secondary | ICD-10-CM

## 2024-07-15 DIAGNOSIS — Z87442 Personal history of urinary calculi: Secondary | ICD-10-CM | POA: Diagnosis not present

## 2024-07-15 DIAGNOSIS — F17210 Nicotine dependence, cigarettes, uncomplicated: Secondary | ICD-10-CM | POA: Diagnosis not present

## 2024-07-15 HISTORY — PX: CYSTOSCOPY/URETEROSCOPY/HOLMIUM LASER/STENT PLACEMENT: SHX6546

## 2024-07-15 HISTORY — PX: CYSTOSCOPY/RETROGRADE/URETEROSCOPY/STONE EXTRACTION WITH BASKET: SHX5317

## 2024-07-15 HISTORY — PX: HOLMIUM LASER APPLICATION: SHX5852

## 2024-07-15 HISTORY — PX: URETEROSCOPY: SHX842

## 2024-07-15 SURGERY — CYSTOSCOPY/URETEROSCOPY/HOLMIUM LASER/STENT PLACEMENT
Anesthesia: General | Site: Ureter | Laterality: Right

## 2024-07-15 MED ORDER — PROPOFOL 10 MG/ML IV BOLUS
INTRAVENOUS | Status: DC | PRN
Start: 2024-07-15 — End: 2024-07-15
  Administered 2024-07-15: 300 mg via INTRAVENOUS

## 2024-07-15 MED ORDER — OXYCODONE HCL 5 MG PO TABS: 5.0000 mg | ORAL_TABLET | Freq: Once | ORAL | Status: DC | PRN

## 2024-07-15 MED ORDER — MIDAZOLAM HCL (PF) 2 MG/2ML IJ SOLN
INTRAMUSCULAR | Status: DC | PRN
  Administered 2024-07-15: 2 mg via INTRAVENOUS

## 2024-07-15 MED ORDER — LACTATED RINGERS IV SOLN: INTRAVENOUS | Status: DC

## 2024-07-15 MED ORDER — SODIUM CHLORIDE 0.9 % IR SOLN
Status: DC | PRN
  Administered 2024-07-15: 3000 mL

## 2024-07-15 MED ORDER — LIDOCAINE 2% (20 MG/ML) 5 ML SYRINGE
INTRAMUSCULAR | Status: DC | PRN
  Administered 2024-07-15: 60 mg via INTRAVENOUS

## 2024-07-15 MED ORDER — HYDROMORPHONE HCL 1 MG/ML IJ SOLN: 0.2500 mg | INTRAMUSCULAR | Status: DC | PRN

## 2024-07-15 MED ORDER — OXYCODONE HCL 5 MG/5ML PO SOLN: 5.0000 mg | Freq: Once | ORAL | Status: DC | PRN

## 2024-07-15 MED ORDER — DEXMEDETOMIDINE HCL IN NACL 80 MCG/20ML IV SOLN
INTRAVENOUS | Status: DC | PRN
  Administered 2024-07-15: 8 ug via INTRAVENOUS
  Administered 2024-07-15: 4 ug via INTRAVENOUS

## 2024-07-15 MED ORDER — WATER FOR IRRIGATION, STERILE IR SOLN
Status: DC | PRN
  Administered 2024-07-15: 500 mL

## 2024-07-15 MED ORDER — LACTATED RINGERS IV SOLN: INTRAVENOUS | Status: DC | PRN

## 2024-07-15 MED ORDER — ORAL CARE MOUTH RINSE: 15.0000 mL | Freq: Once | OROMUCOSAL | Status: AC

## 2024-07-15 MED ORDER — OXYCODONE-ACETAMINOPHEN 5-325 MG PO TABS: 1.0000 | ORAL_TABLET | ORAL | 0 refills | Status: DC | PRN

## 2024-07-15 MED ORDER — TAMSULOSIN HCL 0.4 MG PO CAPS: 0.4000 mg | ORAL_CAPSULE | Freq: Every day | ORAL | 0 refills | Status: DC

## 2024-07-15 MED ORDER — FENTANYL CITRATE (PF) 100 MCG/2ML IJ SOLN
INTRAMUSCULAR | Status: DC | PRN
  Administered 2024-07-15: 100 ug via INTRAVENOUS

## 2024-07-15 MED ORDER — CHLORHEXIDINE GLUCONATE 0.12 % MT SOLN
15.0000 mL | Freq: Once | OROMUCOSAL | Status: AC
  Administered 2024-07-15: 15 mL via OROMUCOSAL

## 2024-07-15 MED ORDER — FENTANYL CITRATE (PF) 100 MCG/2ML IJ SOLN
INTRAMUSCULAR | Status: AC
  Filled 2024-07-15: qty 2

## 2024-07-15 MED ORDER — ONDANSETRON HCL 4 MG PO TABS: 4.0000 mg | ORAL_TABLET | Freq: Every day | ORAL | 1 refills | Status: AC | PRN

## 2024-07-15 MED ORDER — MIDAZOLAM HCL 2 MG/2ML IJ SOLN
INTRAMUSCULAR | Status: AC
  Filled 2024-07-15: qty 2

## 2024-07-15 MED ORDER — DIATRIZOATE MEGLUMINE 30 % UR SOLN
URETHRAL | Status: AC
  Filled 2024-07-15: qty 100

## 2024-07-15 MED ORDER — SUCCINYLCHOLINE CHLORIDE 200 MG/10ML IV SOSY
PREFILLED_SYRINGE | INTRAVENOUS | Status: DC | PRN
  Administered 2024-07-15: 140 mg via INTRAVENOUS

## 2024-07-15 MED ORDER — DIATRIZOATE MEGLUMINE 30 % UR SOLN
URETHRAL | Status: DC | PRN
  Administered 2024-07-15: 12 mL via URETHRAL

## 2024-07-15 SURGICAL SUPPLY — 24 items
BAG DRAIN URO TABLE W/ADPT NS (BAG) ×4 IMPLANT
BAG HAMPER (MISCELLANEOUS) ×4 IMPLANT
BASKET NITINOL 4 WIRE 16 (BASKET) ×4 IMPLANT
CATH URETL OPEN END 6FR 70 (CATHETERS) ×4 IMPLANT
CLOTH BEACON ORANGE TIMEOUT ST (SAFETY) ×4 IMPLANT
EXTRACTOR STONE NITINOL NGAGE (UROLOGICAL SUPPLIES) ×4 IMPLANT
GLOVE BIO SURGEON STRL SZ8 (GLOVE) ×4 IMPLANT
GLOVE BIOGEL PI IND STRL 7.0 (GLOVE) ×8 IMPLANT
GOWN STRL REUS W/TWL LRG LVL3 (GOWN DISPOSABLE) ×4 IMPLANT
GOWN STRL REUS W/TWL XL LVL3 (GOWN DISPOSABLE) ×4 IMPLANT
GUIDEWIRE STR DUAL SENSOR (WIRE) ×4 IMPLANT
GUIDEWIRE STR ZIPWIRE 035X150 (MISCELLANEOUS) ×4 IMPLANT
KIT TURNOVER CYSTO (KITS) ×4 IMPLANT
MANIFOLD NEPTUNE II (INSTRUMENTS) ×4 IMPLANT
PACK CYSTO (CUSTOM PROCEDURE TRAY) ×4 IMPLANT
PAD ARMBOARD POSITIONER FOAM (MISCELLANEOUS) ×4 IMPLANT
POSITIONER HEAD 8X9X4 ADT (SOFTGOODS) ×4 IMPLANT
SHEATH NAVIGATOR HD 11/13X36 (SHEATH) ×4 IMPLANT
SOL .9 NS 3000ML IRR UROMATIC (IV SOLUTION) ×8 IMPLANT
STENT URET 6FRX26 CONTOUR (STENTS) IMPLANT
SYR 10ML LL (SYRINGE) ×4 IMPLANT
TOWEL OR 17X26 4PK STRL BLUE (TOWEL DISPOSABLE) ×4 IMPLANT
TRACTIP FLEXIVA PULS ID 200XHI (Laser) IMPLANT
WATER STERILE IRR 500ML POUR (IV SOLUTION) ×4 IMPLANT

## 2024-07-15 NOTE — Anesthesia Preprocedure Evaluation (Addendum)
 Anesthesia Evaluation  Patient identified by MRN, date of birth, ID band Patient awake    Reviewed: Allergy & Precautions, H&P , NPO status , Patient's Chart, lab work & pertinent test results  Airway Mallampati: I  TM Distance: >3 FB Neck ROM: Full    Dental no notable dental hx.    Pulmonary Current SmokerPatient did not abstain from smoking. Vapes occasionally One puff this am   Pulmonary exam normal breath sounds clear to auscultation       Cardiovascular Normal cardiovascular exam+ Valvular Problems/Murmurs  Rhythm:Regular Rate:Normal     Neuro/Psych  PSYCHIATRIC DISORDERS Anxiety Depression    negative neurological ROS     GI/Hepatic negative GI ROS, Neg liver ROS,,,  Endo/Other  negative endocrine ROS    Renal/GU negative Renal ROS  negative genitourinary   Musculoskeletal  (+) Arthritis ,    Abdominal   Peds negative pediatric ROS (+)  Hematology negative hematology ROS (+)   Anesthesia Other Findings   Reproductive/Obstetrics negative OB ROS                              Anesthesia Physical Anesthesia Plan  ASA: 1  Anesthesia Plan: General   Post-op Pain Management:    Induction: Intravenous  PONV Risk Score and Plan:   Airway Management Planned: Oral ETT  Additional Equipment:   Intra-op Plan:   Post-operative Plan: Extubation in OR  Informed Consent: I have reviewed the patients History and Physical, chart, labs and discussed the procedure including the risks, benefits and alternatives for the proposed anesthesia with the patient or authorized representative who has indicated his/her understanding and acceptance.     Dental advisory given  Plan Discussed with: CRNA  Anesthesia Plan Comments:          Anesthesia Quick Evaluation

## 2024-07-15 NOTE — Transfer of Care (Signed)
 Immediate Anesthesia Transfer of Care Note  Patient: Cody Fuller  Procedure(s) Performed: CYSTOSCOPY/URETEROSCOPY/HOLMIUM LASER/STENT PLACEMENT (Bilateral: Ureter) HOLMIUM LASER APPLICATION (Bilateral: Renal) CYSTOSCOPY, WITH CALCULUS REMOVAL USING BASKET (Renal) DIAGNOSTIC URETEROSCOPY RIGHT (Right: Ureter)  Patient Location: PACU  Anesthesia Type:General  Level of Consciousness: awake  Airway & Oxygen Therapy: Patient Spontanous Breathing  Post-op Assessment: Report given to RN  Post vital signs: Reviewed and stable  Last Vitals:  Vitals Value Taken Time  BP 145/80 07/15/24 12:53  Temp 36.5 C 07/15/24 12:53  Pulse 81 07/15/24 12:55  Resp 18 07/15/24 12:55  SpO2 93 % 07/15/24 12:55  Vitals shown include unfiled device data.  Last Pain:  Vitals:   07/15/24 0927  TempSrc: Oral  PainSc: 0-No pain         Complications: No notable events documented.

## 2024-07-15 NOTE — Interval H&P Note (Signed)
 History and Physical Interval Note:  07/15/2024 11:02 AM  Cody Fuller  has presented today for surgery, with the diagnosis of bilateral renal calculi.  The various methods of treatment have been discussed with the patient and family. After consideration of risks, benefits and other options for treatment, the patient has consented to  Procedure(s): CYSTOSCOPY/URETEROSCOPY/HOLMIUM LASER/STENT PLACEMENT (Bilateral) HOLMIUM LASER APPLICATION (Bilateral) as a surgical intervention.  The patient's history has been reviewed, patient examined, no change in status, stable for surgery.  I have reviewed the patient's chart and labs.  Questions were answered to the patient's satisfaction.     [name]

## 2024-07-15 NOTE — Progress Notes (Signed)
 Called patient to arrive at 0900 today due to changes in the OR schedule, patient voiced understanding.

## 2024-07-15 NOTE — Op Note (Signed)
 SABRAPreoperative diagnosis: bilateral renal calculi  Postoperative diagnosis: Same  Procedure: 1 cystoscopy 2. bilateralretrograde pyelography 3.  Intraoperative fluoroscopy, under one hour, with interpretation 4.  Left ureteroscopic stone extraction with laser lithotripsy 5.  Right diagnostic ureteroscopy 6. bilateral 6 x 26 JJ stent placement  Attending: Belvie Standing  Anesthesia: General  Estimated blood loss: None  Drains: bilateral 6 x 26 JJ ureteral stent with tether  Specimens: stone for analysis  Antibiotics: ancef  Findings: left mid and lower pole renal calculi. No hydronephrosis on left. Right UPJ narrowing with inability to advanced scope into renal pelvis. No masses/lesions in the bladder. Ureteral orifices in normal anatomic location.  Indications: Patient is a 43 year old male with a history of bilateral renal calculi and bilateral flank pain. After discussing treatment options, they decided proceed with bilateral ureteroscopic stone manipulation.  Procedure in detail: The patient was brought to the operating room and a brief timeout was done to ensure correct patient, correct procedure, correct site.  General anesthesia was administered patient was placed in dorsal lithotomy position.  Her genitalia was then prepped and draped in usual sterile fashion.  A rigid 22 French cystoscope was passed in the urethra and the bladder.  Bladder was inspected free masses or lesions.  the ureteral orifices were in the normal orthotopic locations. a 6 french ureteral catheter was then instilled into the left ureteral orifice.  a gentle retrograde was obtained and findings noted above. We then advanced a zipwire through the catheter and up to the renal pelvis.  we then removed the cystoscope and cannulated the left ureteral orifice with a semirigid ureteroscope.  We located no stone in the ureter. We then placed a sensor wire up to the renal pelvis. We removed the scope and advanced a  12/14 x 38cm access sheath up to the renal pelvis. We then used the flexible ureteroscope to perform nephroscopy. We located calculi in the mid and lower poles which were fragmented with a 242nm laser fiber. The fragments were removed with an NGage basket. Once the stone were removed we then removed the access sheath under direct vision and noted to injury to the ureter.  We then placed a 6 x 26 double-j ureteral stent over the original zip wire. We then removed the wire and good coil was noted in the the renal pelvis under fluoroscopy and the bladder under direct vision.  We then turned out attention to the right side. a 6 french ureteral catheter was then instilled into the right ureteral orifice.  a gentle retrograde was obtained and findings noted above. We then advanced a zipwire through the catheter and up to the renal pelvis. We then advanced a zipwire through the catheter and up to the renal pelvis.  we then removed the cystoscope and cannulated the left ureteral orifice with a semirigid ureteroscope.  We located no stone in the ureter. We then placed a sensor wire up to the renal pelvis. We removed the scope and advanced a 12/14 x 38cm access sheath up to the proximal ureter. We then used the flexible ureteroscope to perform ureteroscopy and we encountered a naroowing at the right UPJ which we were unable to navigate the scope past the narrowing. We elected to place a stent. Once this was complete we then removed the access sheath under direct vision and noted to injury to the ureter. we then placed a 6 x 26 double-j ureteral stent over the original zip wire.  We then removed the wire and good  coil was noted in the the renal pelvis under fluoroscopy and the bladder under direct vision.   the bladder was then drained and this concluded the procedure which was well tolerated by patient.  Complications: None  Condition: Stable, extubated, transferred to PACU  Plan: Patient is to be discharged home as to  follow-up in 2 weeks for right ureteroscopic stone extraction

## 2024-07-15 NOTE — Anesthesia Procedure Notes (Signed)
 Procedure Name: Intubation Date/Time: 07/15/2024 11:38 AM  Performed by: Elaine Delon CROME, CRNAPre-anesthesia Checklist: Patient identified, Emergency Drugs available, Suction available and Patient being monitored Patient Re-evaluated:Patient Re-evaluated prior to induction Oxygen Delivery Method: Circle system utilized Preoxygenation: Pre-oxygenation with 100% oxygen Induction Type: IV induction Ventilation: Mask ventilation without difficulty Grade View: Grade II Tube type: Oral Tube size: 7.5 mm Number of attempts: 1 Airway Equipment and Method: Stylet Placement Confirmation: ETT inserted through vocal cords under direct vision, positive ETCO2 and breath sounds checked- equal and bilateral Secured at: 22 cm Tube secured with: Tape Dental Injury: Teeth and Oropharynx as per pre-operative assessment

## 2024-07-15 NOTE — Anesthesia Postprocedure Evaluation (Signed)
 Anesthesia Post Note  Patient: Keeshawn Fakhouri  Procedure(s) Performed: CYSTOSCOPY/URETEROSCOPY/HOLMIUM LASER/STENT PLACEMENT (Bilateral: Ureter) HOLMIUM LASER APPLICATION (Bilateral: Renal) CYSTOSCOPY, WITH CALCULUS REMOVAL USING BASKET (Renal) DIAGNOSTIC URETEROSCOPY RIGHT (Right: Ureter)  Patient location during evaluation: PACU Anesthesia Type: General Level of consciousness: awake and alert Pain management: pain level controlled Vital Signs Assessment: post-procedure vital signs reviewed and stable Respiratory status: spontaneous breathing, nonlabored ventilation, respiratory function stable and patient connected to nasal cannula oxygen Cardiovascular status: blood pressure returned to baseline and stable Postop Assessment: no apparent nausea or vomiting Anesthetic complications: no   No notable events documented.   Last Vitals:  Vitals:   07/15/24 1308 07/15/24 1311  BP: 128/71   Pulse: 92 83  Resp: 17 (!) 21  Temp:    SpO2: 95% 98%    Last Pain:  Vitals:   07/15/24 1308  TempSrc:   PainSc: 0-No pain                 Andrea Limes

## 2024-07-16 ENCOUNTER — Encounter (HOSPITAL_COMMUNITY): Payer: Self-pay | Admitting: Urology

## 2024-07-19 ENCOUNTER — Telehealth: Payer: Self-pay | Admitting: Urology

## 2024-07-19 NOTE — Telephone Encounter (Signed)
 Please see patient concern below.  Do you have any other recommendations at this time?  I will offer patient 11/20 surgery date.

## 2024-07-19 NOTE — Telephone Encounter (Signed)
 Patient left message that he had surgery and the pain medication is not working. He has lower back pain. Please call [phone]

## 2024-07-20 ENCOUNTER — Other Ambulatory Visit: Payer: Self-pay | Admitting: Urology

## 2024-07-20 MED ORDER — OXYCODONE-ACETAMINOPHEN 10-325 MG PO TABS: 1.0000 | ORAL_TABLET | ORAL | 0 refills | Status: DC | PRN

## 2024-07-21 ENCOUNTER — Telehealth: Payer: Self-pay

## 2024-07-21 NOTE — Telephone Encounter (Signed)
 Pt called in today with concern about dark blood after stent placement. Pt is made aware that having blood in the urine is completed normal. Pt is made aware that dark blood in the urine is old blood that is passing. Pt state's that he has seen blood in urine for awhile it was lighten up and now it is dark red and he is concern that something is wrong. Pt is advise that he can go to the ED for the dark red blood in the urine. Pt voiced understanding

## 2024-07-26 ENCOUNTER — Ambulatory Visit: Admitting: Urology

## 2024-07-27 LAB — STONE ANALYSIS
Calcium Oxalate Dihydrate: 40 %
Calcium Oxalate Monohydrate: 60 %
Weight Calculi: 49 mg

## 2024-07-30 ENCOUNTER — Other Ambulatory Visit (HOSPITAL_COMMUNITY)

## 2024-08-02 ENCOUNTER — Encounter (HOSPITAL_COMMUNITY): Payer: Self-pay

## 2024-08-02 ENCOUNTER — Encounter (HOSPITAL_COMMUNITY)
Admission: RE | Admit: 2024-08-02 | Discharge: 2024-08-02 | Disposition: A | Discharge status: Home / Self Care | Source: Ambulatory Visit | Attending: Urology | Admitting: Urology

## 2024-08-04 MED ORDER — GENTAMICIN SULFATE 40 MG/ML IJ SOLN
5.0000 mg/kg | INTRAVENOUS | Status: AC
  Administered 2024-08-05: 551.2 mg via INTRAVENOUS
  Filled 2024-08-04: qty 13.75

## 2024-08-04 NOTE — Anesthesia Preprocedure Evaluation (Signed)
 Anesthesia Evaluation  Patient identified by MRN, date of birth, ID band Patient awake    Reviewed: Allergy & Precautions, H&P , NPO status , Patient's Chart, lab work & pertinent test results  Airway Mallampati: I  TM Distance: >3 FB Neck ROM: Full    Dental no notable dental hx. (+) Dental Advisory Given, Teeth Intact   Pulmonary Current SmokerPatient did not abstain from smoking. Vapes occasionally One puff this am   Pulmonary exam normal breath sounds clear to auscultation       Cardiovascular Normal cardiovascular exam+ Valvular Problems/Murmurs  Rhythm:Regular Rate:Normal     Neuro/Psych  PSYCHIATRIC DISORDERS Anxiety Depression    negative neurological ROS     GI/Hepatic negative GI ROS, Neg liver ROS,,,  Endo/Other  negative endocrine ROS    Renal/GU negative Renal ROS  negative genitourinary   Musculoskeletal  (+) Arthritis , Osteoarthritis,    Abdominal   Peds negative pediatric ROS (+)  Hematology negative hematology ROS (+)   Anesthesia Other Findings   Reproductive/Obstetrics negative OB ROS                              Anesthesia Physical Anesthesia Plan  ASA: 2  Anesthesia Plan: General   Post-op Pain Management: Dilaudid  IV   Induction: Intravenous  PONV Risk Score and Plan: Ondansetron , Dexamethasone and Midazolam   Airway Management Planned: Oral ETT  Additional Equipment: None  Intra-op Plan:   Post-operative Plan: Extubation in OR  Informed Consent: I have reviewed the patients History and Physical, chart, labs and discussed the procedure including the risks, benefits and alternatives for the proposed anesthesia with the patient or authorized representative who has indicated his/her understanding and acceptance.     Dental advisory given  Plan Discussed with: CRNA  Anesthesia Plan Comments:          Anesthesia Quick Evaluation

## 2024-08-05 ENCOUNTER — Ambulatory Visit (HOSPITAL_COMMUNITY)

## 2024-08-05 ENCOUNTER — Encounter (HOSPITAL_COMMUNITY): Payer: Self-pay | Admitting: Urology

## 2024-08-05 ENCOUNTER — Encounter (HOSPITAL_COMMUNITY): Admission: RE | Disposition: A | Payer: Self-pay | Source: Home / Self Care | Attending: Urology

## 2024-08-05 ENCOUNTER — Other Ambulatory Visit: Payer: Self-pay

## 2024-08-05 ENCOUNTER — Ambulatory Visit (HOSPITAL_BASED_OUTPATIENT_CLINIC_OR_DEPARTMENT_OTHER): Payer: Self-pay | Admitting: Anesthesiology

## 2024-08-05 ENCOUNTER — Ambulatory Visit (HOSPITAL_COMMUNITY)
Admission: RE | Admit: 2024-08-05 | Discharge: 2024-08-05 | Disposition: A | Discharge status: Home / Self Care | Attending: Urology | Admitting: Urology

## 2024-08-05 ENCOUNTER — Encounter (HOSPITAL_COMMUNITY): Payer: Self-pay | Admitting: Anesthesiology

## 2024-08-05 DIAGNOSIS — F17210 Nicotine dependence, cigarettes, uncomplicated: Secondary | ICD-10-CM | POA: Diagnosis not present

## 2024-08-05 DIAGNOSIS — N200 Calculus of kidney: Secondary | ICD-10-CM | POA: Insufficient documentation

## 2024-08-05 DIAGNOSIS — M1990 Unspecified osteoarthritis, unspecified site: Secondary | ICD-10-CM | POA: Diagnosis not present

## 2024-08-05 DIAGNOSIS — F32A Depression, unspecified: Secondary | ICD-10-CM | POA: Diagnosis not present

## 2024-08-05 DIAGNOSIS — F419 Anxiety disorder, unspecified: Secondary | ICD-10-CM | POA: Diagnosis not present

## 2024-08-05 HISTORY — PX: HOLMIUM LASER APPLICATION: SHX5852

## 2024-08-05 HISTORY — DX: Personal history of urinary calculi: Z87.442

## 2024-08-05 HISTORY — PX: CYSTOSCOPY/RETROGRADE/URETEROSCOPY/STONE EXTRACTION WITH BASKET: SHX5317

## 2024-08-05 HISTORY — PX: CYSTOSCOPY/URETEROSCOPY/HOLMIUM LASER/STENT PLACEMENT: SHX6546

## 2024-08-05 HISTORY — PX: CYSTOSCOPY W/ URETERAL STENT REMOVAL: SHX1430

## 2024-08-05 SURGERY — CYSTOSCOPY/URETEROSCOPY/HOLMIUM LASER/STENT PLACEMENT
Anesthesia: General | Site: Ureter | Laterality: Right

## 2024-08-05 MED ORDER — LIDOCAINE 2% (20 MG/ML) 5 ML SYRINGE
INTRAMUSCULAR | Status: DC | PRN
  Administered 2024-08-05: 100 mg via INTRAVENOUS

## 2024-08-05 MED ORDER — PROPOFOL 500 MG/50ML IV EMUL
INTRAVENOUS | Status: DC | PRN
  Administered 2024-08-05: 300 mg via INTRAVENOUS

## 2024-08-05 MED ORDER — ONDANSETRON HCL 4 MG/2ML IJ SOLN
INTRAMUSCULAR | Status: DC | PRN
  Administered 2024-08-05: 4 mg via INTRAVENOUS

## 2024-08-05 MED ORDER — SODIUM CHLORIDE 0.9 % IV SOLN: 12.5000 mg | INTRAVENOUS | Status: DC | PRN

## 2024-08-05 MED ORDER — ACETAMINOPHEN 500 MG PO TABS
1000.0000 mg | ORAL_TABLET | Freq: Once | ORAL | Status: AC
  Administered 2024-08-05: 1000 mg via ORAL
  Filled 2024-08-05: qty 2

## 2024-08-05 MED ORDER — DEXAMETHASONE SOD PHOSPHATE PF 10 MG/ML IJ SOLN
INTRAMUSCULAR | Status: DC | PRN
  Administered 2024-08-05: 10 mg via INTRAVENOUS

## 2024-08-05 MED ORDER — ACETAMINOPHEN 160 MG/5ML PO SOLN
960.0000 mg | Freq: Once | ORAL | Status: AC
  Filled 2024-08-05: qty 30

## 2024-08-05 MED ORDER — ORAL CARE MOUTH RINSE: 15.0000 mL | Freq: Once | OROMUCOSAL | Status: AC

## 2024-08-05 MED ORDER — LACTATED RINGERS IV SOLN: INTRAVENOUS | Status: DC

## 2024-08-05 MED ORDER — SODIUM CHLORIDE 0.9 % IR SOLN
Status: DC | PRN
  Administered 2024-08-05: 3000 mL

## 2024-08-05 MED ORDER — DIATRIZOATE MEGLUMINE 30 % UR SOLN
URETHRAL | Status: DC | PRN
  Administered 2024-08-05: 10 mL via URETHRAL

## 2024-08-05 MED ORDER — ROCURONIUM BROMIDE 10 MG/ML (PF) SYRINGE
PREFILLED_SYRINGE | INTRAVENOUS | Status: DC | PRN
  Administered 2024-08-05: 60 mg via INTRAVENOUS

## 2024-08-05 MED ORDER — MIDAZOLAM HCL (PF) 2 MG/2ML IJ SOLN
INTRAMUSCULAR | Status: DC | PRN
  Administered 2024-08-05: 2 mg via INTRAVENOUS

## 2024-08-05 MED ORDER — OXYCODONE-ACETAMINOPHEN 10-325 MG PO TABS
1.0000 | ORAL_TABLET | ORAL | 0 refills | Status: AC | PRN
Start: 2024-08-05 — End: ?

## 2024-08-05 MED ORDER — CHLORHEXIDINE GLUCONATE 0.12 % MT SOLN
15.0000 mL | Freq: Once | OROMUCOSAL | Status: AC
  Administered 2024-08-05: 15 mL via OROMUCOSAL

## 2024-08-05 MED ORDER — MIDAZOLAM HCL 2 MG/2ML IJ SOLN
INTRAMUSCULAR | Status: AC
  Filled 2024-08-05: qty 2

## 2024-08-05 MED ORDER — CHLORHEXIDINE GLUCONATE 0.12 % MT SOLN
OROMUCOSAL | Status: AC
  Filled 2024-08-05: qty 15

## 2024-08-05 MED ORDER — SUGAMMADEX SODIUM 200 MG/2ML IV SOLN
INTRAVENOUS | Status: DC | PRN
  Administered 2024-08-05: 200 mg via INTRAVENOUS

## 2024-08-05 MED ORDER — OXYCODONE HCL 5 MG PO TABS
5.0000 mg | ORAL_TABLET | Freq: Once | ORAL | Status: AC | PRN
  Administered 2024-08-05: 5 mg via ORAL
  Filled 2024-08-05: qty 1

## 2024-08-05 MED ORDER — WATER FOR IRRIGATION, STERILE IR SOLN
Status: DC | PRN
  Administered 2024-08-05: 500 mL

## 2024-08-05 MED ORDER — HYDROMORPHONE HCL 1 MG/ML IJ SOLN: 0.2500 mg | INTRAMUSCULAR | Status: DC | PRN

## 2024-08-05 MED ORDER — FENTANYL CITRATE (PF) 100 MCG/2ML IJ SOLN
INTRAMUSCULAR | Status: DC | PRN
  Administered 2024-08-05: 100 ug via INTRAVENOUS

## 2024-08-05 MED ORDER — DIATRIZOATE MEGLUMINE 30 % UR SOLN
URETHRAL | Status: AC
  Filled 2024-08-05: qty 100

## 2024-08-05 MED ORDER — FENTANYL CITRATE (PF) 100 MCG/2ML IJ SOLN
INTRAMUSCULAR | Status: AC
  Filled 2024-08-05: qty 2

## 2024-08-05 MED ORDER — OXYCODONE HCL 5 MG/5ML PO SOLN: 5.0000 mg | Freq: Once | ORAL | Status: AC | PRN

## 2024-08-05 SURGICAL SUPPLY — 20 items
BAG DRAIN URO TABLE W/ADPT NS (BAG) ×2 IMPLANT
BASKET NITINOL 4 WIRE 16 (BASKET) ×2 IMPLANT
CATH URETL OPEN END 6FR 70 (CATHETERS) ×2 IMPLANT
EXTRACTOR STONE NITINOL NGAGE (UROLOGICAL SUPPLIES) ×2 IMPLANT
GLOVE BIO SURGEON STRL SZ8 (GLOVE) ×2 IMPLANT
GLOVE BIOGEL PI IND STRL 7.0 (GLOVE) ×4 IMPLANT
GOWN STRL REUS W/TWL LRG LVL3 (GOWN DISPOSABLE) ×2 IMPLANT
GOWN STRL REUS W/TWL XL LVL3 (GOWN DISPOSABLE) ×2 IMPLANT
GUIDEWIRE STR DUAL SENSOR (WIRE) ×2 IMPLANT
GUIDEWIRE STR ZIPWIRE 035X150 (MISCELLANEOUS) ×2 IMPLANT
KIT TURNOVER CYSTO (KITS) ×2 IMPLANT
MANIFOLD NEPTUNE II (INSTRUMENTS) ×2 IMPLANT
PACK CYSTO (CUSTOM PROCEDURE TRAY) ×2 IMPLANT
PAD ARMBOARD POSITIONER FOAM (MISCELLANEOUS) ×2 IMPLANT
POSITIONER HEAD 8X9X4 ADT (SOFTGOODS) ×2 IMPLANT
SHEATH NAVIGATOR HD 11/13X36 (SHEATH) ×2 IMPLANT
SOL .9 NS 3000ML IRR UROMATIC (IV SOLUTION) ×4 IMPLANT
SYR 10ML LL (SYRINGE) ×2 IMPLANT
TOWEL OR 17X26 4PK STRL BLUE (TOWEL DISPOSABLE) ×2 IMPLANT
WATER STERILE IRR 500ML POUR (IV SOLUTION) ×2 IMPLANT

## 2024-08-05 NOTE — Op Note (Signed)
 Preoperative diagnosis: Right renal stone  Postoperative diagnosis: Same  Procedure: 1 cystoscopy 2. Right retrograde pyelography 3.  Intraoperative fluoroscopy, under one hour, with interpretation 4.  Right ureteroscopic stone manipulation with basket extraction  Attending: Belvie Standing  Anesthesia: General  Estimated blood loss: None  Drains: none  Specimens: stone for analysis  Antibiotics: ancef  Findings: Right lower pole stone. No hydronephrosis. No masses/lesions in the bladder. Ureteral orifices in normal anatomic location.  Indications: Patient is a 43 year old male with a history of right renal stone and who has persistent right flank pain.  After discussing treatment options, he decided proceed with right ureteroscopic stone manipulation.  Procedure in detail: The patient was brought to the operating room and a brief timeout was done to ensure correct patient, correct procedure, correct site.  General anesthesia was administered patient was placed in dorsal lithotomy position.  His genitalia was then prepped and draped in usual sterile fashion.  A rigid 22 French cystoscope was passed in the urethra and the bladder.  Bladder was inspected free masses or lesions.  the ureteral orifices were in the normal orthotopic locations. Using a grasper we removed the left ureteral stent. a 6 french ureteral catheter was then instilled into the right ureteral orifice.  a gentle retrograde was obtained and findings noted above. Using a grasper the right ureteral stent was brought to the urethral meatus. we then placed a zip wire through the ureteral stent and advanced up to the renal pelvis.  The stent was removed. we then removed the cystoscope and cannulated the right ureteral orifice with a semirigid ureteroscope.  No stone was found in the ureter. Once we reached the UPJ a sensor wire was advanced in to the renal pelvis. We then removed the ureteroscope and advanced am 12/14 x 36cm  access sheath up to the renal pelvis. We then used the flexible ureteroscope to perform nephroscopy. We encountered the stone in the lower pole which was removed with an NGage basket. We then removed the access sheath under direct vision and noted no injury to the ureter. We then removed the zipwire.the bladder was then drained and this concluded the procedure which was well tolerated by patient.  Complications: None  Condition: Stable, extubated, transferred to PACU  Plan: Patient is to be discharged home as to follow-up in 1-2 weeks

## 2024-08-05 NOTE — Progress Notes (Signed)
 Called patient to see where he is, as he was suppose to arrive at 0600 today for procedure.  No answer on cell, left a voicemail.  Called alternate number-wife: Brandy, she states a friend was bringing the patient and she knows they left the house at 5 something this morning.  Patient coming from [place], [place].  She is going to call patient also.

## 2024-08-05 NOTE — H&P (Signed)
 HPI: [name] is a 42yo here for evaluation of nephrolithiasis. He presented to the ER with left flank pain over 2 months ago and underwent CT which showed left renal calculi up to 1.1cm and a small right renal calculus. He continues to have intermittent left flank pain. No worsening LUTS     PMH:     Past Medical History:  Diagnosis Date   Anxiety     Arthritis     Back pain     Chronic fatigue     Depression     Elevated blood pressure reading without diagnosis of hypertension     Gout     Heart murmur     Sleep disorder            Surgical History:      Past Surgical History:  Procedure Laterality Date   TONSILLECTOMY              Home Medications:  Allergies as of 06/30/2024         Reactions    Codeine      Penicillins              Medication List           Accurate as of June 30, 2024  2:53 PM. If you have any questions, ask your nurse or doctor.              buPROPion 300 MG 24 hr tablet Commonly known as: WELLBUTRIN XL Take 300 mg by mouth daily.    busPIRone 10 MG tablet Commonly known as: BUSPAR Take 10 mg by mouth 3 (three) times daily.    cyclobenzaprine  10 MG tablet Commonly known as: FLEXERIL  Take 1 tablet (10 mg total) by mouth 3 (three) times daily as needed for muscle spasms.    FLUoxetine 40 MG capsule Commonly known as: PROZAC Take 40 mg by mouth daily.    oxyCODONE -acetaminophen  10-325 MG tablet Commonly known as: PERCOCET Take 1 tablet by mouth every 4 (four) hours as needed for pain.    oxyCODONE -acetaminophen  5-325 MG tablet Commonly known as: PERCOCET/ROXICET Take 1 tablet by mouth every 6 (six) hours as needed for severe pain (pain score 7-10).    tamsulosin  0.4 MG Caps capsule Commonly known as: FLOMAX  Take 1 capsule (0.4 mg total) by mouth daily.    tizanidine 2 MG capsule Commonly known as: ZANAFLEX Take 2 mg by mouth daily as needed for muscle spasms.             Allergies:  Allergies       Allergies  Allergen Reactions   Codeine     Penicillins          Family History:      Family History  Problem Relation Age of Onset   HIV Father     Cancer Mother            Social History:  reports that he has been smoking cigarettes. He started smoking about 5 years ago. He has never used smokeless tobacco. He reports that he does not drink alcohol and does not use drugs.   ROS: All other review of systems were reviewed and are negative except what is noted above in HPI   Physical Exam: BP (!) 151/84   Pulse 80   Constitutional:  Alert and oriented, No acute distress. HEENT: [place] AT, moist mucus membranes.  Trachea midline, no masses. Cardiovascular: No clubbing, cyanosis, or edema. Respiratory: Normal respiratory effort, no increased  work of breathing. GI: Abdomen is soft, nontender, nondistended, no abdominal masses GU: No CVA tenderness.  Lymph: No cervical or inguinal lymphadenopathy. Skin: No rashes, bruises or suspicious lesions. Neurologic: Grossly intact, no focal deficits, moving all 4 extremities. Psychiatric: Normal mood and affect.   Laboratory Data: Recent Labs       Lab Results  Component Value Date    WBC 4.9 04/19/2024    HGB 16.7 04/19/2024    HCT 50.0 04/19/2024    MCV 82.4 04/19/2024    PLT 211 04/19/2024        Recent Labs       Lab Results  Component Value Date    CREATININE 1.25 (H) 04/19/2024        Recent Labs  No results found for: PSA     Recent Labs       Lab Results  Component Value Date    TESTOSTERONE 865 04/28/2020        Recent Labs  No results found for: HGBA1C     Urinalysis Labs (Brief)          Component Value Date/Time    APPEARANCEUR Clear 04/27/2020 1313    GLUCOSEU Negative 04/27/2020 1313    BILIRUBINUR Negative 04/27/2020 1313    PROTEINUR Negative 04/27/2020 1313    NITRITE Negative 04/27/2020 1313    LEUKOCYTESUR Negative 04/27/2020 1313        Recent Labs       Lab Results   Component Value Date    LABMICR Comment 04/27/2020        Pertinent Imaging: CT 8/4 and KUB today: Images reviewed and discussed with the patient  No results found for this or any previous visit.   No results found for this or any previous visit.   No results found for this or any previous visit.   No results found for this or any previous visit.   No results found for this or any previous visit.   No results found for this or any previous visit.   No results found for this or any previous visit.   Results for orders placed during the hospital encounter of 04/19/24   CT Renal Stone Study   Narrative EXAM: CT UROGRAM 04/19/2024 06:26:00 PM   TECHNIQUE: CT of the abdomen and pelvis was performed before and after the administration of intravenous contrast as per CT urogram protocol. Automated exposure control, iterative reconstruction, and/or weight based adjustment of the mA/kV was utilized to reduce the radiation dose to as low as reasonably achievable.   COMPARISON: None available.   CLINICAL HISTORY: Abdominal/flank pain, stone suspected. MD told him to come over due to 1.1 cm kidney stone in his left kidney. He reports extreme pain.   FINDINGS:   LOWER CHEST: No acute abnormality.   LIVER: The liver is unremarkable.   GALLBLADDER AND BILE DUCTS: Gallbladder is unremarkable. No biliary ductal dilatation.   SPLEEN: No acute abnormality.   PANCREAS: No acute abnormality.   ADRENAL GLANDS: No acute abnormality.   KIDNEYS, URETERS AND BLADDER: Bilateral nonobstructing nephrolithiasis. No hydronephrosis. No perinephric or periureteral stranding. Urinary bladder is unremarkable.   GI AND BOWEL: Stomach demonstrates no acute abnormality. There is no bowel obstruction. Normal appendix.   PERITONEUM AND RETROPERITONEUM: No ascites. No free air.   VASCULATURE: Aorta is normal in caliber.   LYMPH NODES: No lymphadenopathy.   REPRODUCTIVE  ORGANS: No acute abnormality.   BONES AND SOFT TISSUES: No acute osseous abnormality. No focal  soft tissue abnormality.   IMPRESSION: 1. Bilateral nonobstructing nephrolithiasis, with the largest stone measuring 0.9 cm on the left. 2. No hydronephrosis.   Electronically signed by: Norman Gatlin MD 04/19/2024 07:23 PM EDT RP Workstation: [id number]     Assessment & Plan:     1. Nephrolithiasis (Primary) -We discussed the management of kidney stones. These options include observation, ureteroscopy, shockwave lithotripsy (ESWL) and percutaneous nephrolithotomy (PCNL). We discussed which options are relevant to the patient's stone(s). We discussed the natural history of kidney stones as well as the complications of untreated stones and the impact on quality of life without treatment as well as with each of the above listed treatments. We also discussed the efficacy of each treatment in its ability to clear the stone burden. With any of these management options I discussed the signs and symptoms of infection and the need for emergent treatment should these be experienced. For each option we discussed the ability of each procedure to clear the patient of their stone burden.   For observation I described the risks which include but are not limited to silent renal damage, life-threatening infection, need for emergent surgery, failure to pass stone and pain.   For ureteroscopy I described the risks which include bleeding, infection, damage to contiguous structures, positioning injury, ureteral stricture, ureteral avulsion, ureteral injury, need for prolonged ureteral stent, inability to perform ureteroscopy, need for an interval procedure, inability to clear stone burden, stent discomfort/pain, heart attack, stroke, pulmonary embolus and the inherent risks with general anesthesia.   For shockwave lithotripsy I described the risks which include arrhythmia, kidney contusion, kidney hemorrhage, need for  transfusion, pain, inability to adequately break up stone, inability to pass stone fragments, Steinstrasse, infection associated with obstructing stones, need for alternate surgical procedure, need for repeat shockwave lithotripsy, MI, CVA, PE and the inherent risks with anesthesia/conscious sedation.   For PCNL I described the risks including positioning injury, pneumothorax, hydrothorax, need for chest tube, inability to clear stone burden, renal laceration, arterial venous fistula or malformation, need for embolization of kidney, loss of kidney or renal function, need for repeat procedure, need for prolonged nephrostomy tube, ureteral avulsion, MI, CVA, PE and the inherent risks of general anesthesia.   - The patient would like to proceed with right ureteroscopic stone extraction

## 2024-08-05 NOTE — Anesthesia Procedure Notes (Signed)
 Procedure Name: Intubation Date/Time: 08/05/2024 7:47 AM  Performed by: Cordella Elvie HERO, CRNAPre-anesthesia Checklist: Patient identified, Emergency Drugs available, Suction available, Patient being monitored and Timeout performed Patient Re-evaluated:Patient Re-evaluated prior to induction Oxygen Delivery Method: Circle system utilized Preoxygenation: Pre-oxygenation with 100% oxygen Induction Type: IV induction Ventilation: Mask ventilation without difficulty Laryngoscope Size: Mac and 4 Grade View: Grade I Tube type: Oral Tube size: 7.5 mm Number of attempts: 1 Airway Equipment and Method: Stylet Placement Confirmation: ETT inserted through vocal cords under direct vision, positive ETCO2, CO2 detector and breath sounds checked- equal and bilateral Secured at: 23 cm Tube secured with: Tape Dental Injury: Teeth and Oropharynx as per pre-operative assessment

## 2024-08-05 NOTE — Progress Notes (Signed)
 Wife Leotis called back, She said she finally reached him and he is about 5 minutes away.

## 2024-08-05 NOTE — Transfer of Care (Signed)
 Immediate Anesthesia Transfer of Care Note  Patient: Cody Fuller  Procedure(s) Performed: CYSTOSCOPY/URETEROSCOPY (Right: Ureter) HOLMIUM LASER APPLICATION (Right: Ureter) REMOVAL, STENT, URETER, CYSTOSCOPIC (Left: Ureter) CYSTOSCOPY, WITH CALCULUS REMOVAL USING BASKET (Right: Ureter)  Patient Location: PACU  Anesthesia Type:General  Level of Consciousness: awake, alert , oriented, and patient cooperative  Airway & Oxygen Therapy: Patient Spontanous Breathing and Patient connected to nasal cannula oxygen  Post-op Assessment: Report given to RN, Post -op Vital signs reviewed and stable, and Patient moving all extremities X 4  Post vital signs: Reviewed and stable  Last Vitals:  Vitals Value Taken Time  BP 130/86 08/05/24 08:38  Temp    Pulse 77 08/05/24 08:39  Resp 14 08/05/24 08:39  SpO2 91 % 08/05/24 08:39  Vitals shown include unfiled device data.  Last Pain:  Vitals:   08/05/24 0714  TempSrc: Oral  PainSc:       Patients Stated Pain Goal: 8 (08/05/24 9341)  Complications: No notable events documented.

## 2024-08-05 NOTE — Anesthesia Postprocedure Evaluation (Signed)
 Anesthesia Post Note  Patient: Keyion Knack  Procedure(s) Performed: CYSTOSCOPY/URETEROSCOPY (Right: Ureter) HOLMIUM LASER APPLICATION (Right: Ureter) REMOVAL, STENT, URETER, CYSTOSCOPIC (Left: Ureter) CYSTOSCOPY, WITH CALCULUS REMOVAL USING BASKET (Right: Ureter)  Patient location during evaluation: PACU Anesthesia Type: General Level of consciousness: awake and alert Pain management: pain level controlled Vital Signs Assessment: post-procedure vital signs reviewed and stable Respiratory status: spontaneous breathing, nonlabored ventilation, respiratory function stable and patient connected to nasal cannula oxygen Cardiovascular status: blood pressure returned to baseline and stable Postop Assessment: no apparent nausea or vomiting Anesthetic complications: no   There were no known notable events for this encounter.   Last Vitals:  Vitals:   08/05/24 0915 08/05/24 0930  BP: 135/85 (!) 144/92  Pulse: (!) 51 63  Resp: 17 20  Temp:    SpO2: 97% 97%    Last Pain:  Vitals:   08/05/24 0936  TempSrc:   PainSc: 6                  Reeanna Acri L Mareon Robinette

## 2024-08-06 ENCOUNTER — Encounter (HOSPITAL_COMMUNITY): Payer: Self-pay | Admitting: Urology

## 2024-08-06 ENCOUNTER — Other Ambulatory Visit: Payer: Self-pay | Admitting: Urology

## 2024-08-18 ENCOUNTER — Ambulatory Visit: Admitting: Urology

## 2024-08-18 LAB — STONE ANALYSIS
Calcium Oxalate Dihydrate: 70 %
Calcium Oxalate Monohydrate: 25 %
Calcium Phosphate (Hydroxyl): 5 %
Size Calculi: <1 mm
Weight Calculi: <1 mg
